# Patient Record
Sex: Female | Born: 1980 | Race: White | Hispanic: No | Marital: Married | Smoking: Never smoker
Health system: Southern US, Community
[De-identification: ages and names within clinical notes are randomized; demographics above are authoritative.]

## PROBLEM LIST (undated history)

## (undated) DIAGNOSIS — Z789 Other specified health status: Secondary | ICD-10-CM

## (undated) DIAGNOSIS — M549 Dorsalgia, unspecified: Secondary | ICD-10-CM

---
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
## 2000-12-17 ENCOUNTER — Emergency Department (HOSPITAL_COMMUNITY): Admission: EM | Admit: 2000-12-17 | Discharge: 2000-12-17 | Payer: Self-pay | Admitting: *Deleted

## 2000-12-17 ENCOUNTER — Encounter: Payer: Self-pay | Admitting: Emergency Medicine

## 2002-03-04 ENCOUNTER — Emergency Department (HOSPITAL_COMMUNITY): Admission: EM | Admit: 2002-03-04 | Discharge: 2002-03-04 | Payer: Self-pay | Admitting: Emergency Medicine

## 2002-03-04 ENCOUNTER — Encounter: Payer: Self-pay | Admitting: Emergency Medicine

## 2005-06-11 ENCOUNTER — Emergency Department (HOSPITAL_COMMUNITY): Admission: EM | Admit: 2005-06-11 | Discharge: 2005-06-11 | Payer: Self-pay | Admitting: Emergency Medicine

## 2005-07-25 ENCOUNTER — Other Ambulatory Visit: Admission: RE | Admit: 2005-07-25 | Discharge: 2005-07-25 | Payer: Self-pay | Admitting: Obstetrics and Gynecology

## 2006-02-28 ENCOUNTER — Inpatient Hospital Stay (HOSPITAL_COMMUNITY): Admission: AD | Admit: 2006-02-28 | Discharge: 2006-03-02 | Payer: Self-pay | Admitting: *Deleted

## 2007-11-03 ENCOUNTER — Inpatient Hospital Stay (HOSPITAL_COMMUNITY): Admission: AD | Admit: 2007-11-03 | Discharge: 2007-11-05 | Payer: Self-pay | Admitting: Obstetrics and Gynecology

## 2010-08-06 ENCOUNTER — Encounter: Payer: Self-pay | Admitting: Obstetrics and Gynecology

## 2011-04-10 LAB — CBC
HCT: 30.3 — ABNORMAL LOW
HCT: 33.8 — ABNORMAL LOW
Hemoglobin: 10.6 — ABNORMAL LOW
Hemoglobin: 11.6 — ABNORMAL LOW
MCHC: 34.2
MCHC: 35.1
MCV: 79.6
MCV: 80
Platelets: 233
Platelets: 253
RBC: 3.78 — ABNORMAL LOW
RBC: 4.24
RDW: 14.5
RDW: 14.9
WBC: 14 — ABNORMAL HIGH
WBC: 14.1 — ABNORMAL HIGH

## 2011-04-10 LAB — RPR: RPR Ser Ql: NONREACTIVE

## 2012-07-16 NOTE — L&D Delivery Note (Signed)
Patient was C/C/+2 and pushed for 10 minutes with epidural.   NSVD  female infant, Apgars 7,9, weight P- apears large for gest age.   The patient had no lacerations. Fundus was firm. EBL was expected. Placenta was delivered intact. Vagina was clear.  Baby was vigorous to bedside.  Rafael Quesada A

## 2012-10-07 LAB — OB RESULTS CONSOLE ABO/RH: RH Type: POSITIVE

## 2012-10-07 LAB — OB RESULTS CONSOLE HIV ANTIBODY (ROUTINE TESTING): HIV: NONREACTIVE

## 2012-10-07 LAB — OB RESULTS CONSOLE ANTIBODY SCREEN: Antibody Screen: NEGATIVE

## 2012-10-07 LAB — OB RESULTS CONSOLE HEPATITIS B SURFACE ANTIGEN: Hepatitis B Surface Ag: NEGATIVE

## 2012-10-07 LAB — OB RESULTS CONSOLE RUBELLA ANTIBODY, IGM: Rubella: IMMUNE

## 2012-10-07 LAB — OB RESULTS CONSOLE RPR: RPR: NONREACTIVE

## 2013-04-25 ENCOUNTER — Encounter (HOSPITAL_COMMUNITY): Payer: BC Managed Care – PPO | Admitting: Anesthesiology

## 2013-04-25 ENCOUNTER — Inpatient Hospital Stay (HOSPITAL_COMMUNITY)
Admission: RE | Admit: 2013-04-25 | Discharge: 2013-04-27 | DRG: 373 | Disposition: A | Discharge status: Home / Self Care | Payer: BC Managed Care – PPO | Source: Ambulatory Visit | Attending: Obstetrics and Gynecology | Admitting: Obstetrics and Gynecology

## 2013-04-25 ENCOUNTER — Encounter (HOSPITAL_COMMUNITY): Payer: Self-pay

## 2013-04-25 ENCOUNTER — Inpatient Hospital Stay (HOSPITAL_COMMUNITY): Payer: BC Managed Care – PPO | Admitting: Anesthesiology

## 2013-04-25 DIAGNOSIS — O480 Post-term pregnancy: Principal | ICD-10-CM | POA: Diagnosis present

## 2013-04-25 HISTORY — DX: Other specified health status: Z78.9

## 2013-04-25 LAB — CBC
HCT: 32.1 % — ABNORMAL LOW (ref 36.0–46.0)
MCH: 28 pg (ref 26.0–34.0)
MCHC: 34.6 g/dL (ref 30.0–36.0)
RBC: 3.96 MIL/uL (ref 3.87–5.11)
RDW: 14.3 % (ref 11.5–15.5)
WBC: 9.4 10*3/uL (ref 4.0–10.5)

## 2013-04-25 LAB — RPR: RPR Ser Ql: NONREACTIVE

## 2013-04-25 MED ORDER — DIPHENHYDRAMINE HCL 25 MG PO CAPS: 25.0000 mg | ORAL_CAPSULE | Freq: Four times a day (QID) | ORAL | Status: DC | PRN

## 2013-04-25 MED ORDER — IBUPROFEN 600 MG PO TABS: 600.0000 mg | ORAL_TABLET | Freq: Four times a day (QID) | ORAL | Status: DC | PRN

## 2013-04-25 MED ORDER — EPHEDRINE 5 MG/ML INJ
10.0000 mg | INTRAVENOUS | Status: DC | PRN
  Filled 2013-04-25: qty 2
  Filled 2013-04-25: qty 4

## 2013-04-25 MED ORDER — TERBUTALINE SULFATE 1 MG/ML IJ SOLN: 0.2500 mg | Freq: Once | INTRAMUSCULAR | Status: DC | PRN

## 2013-04-25 MED ORDER — DIPHENHYDRAMINE HCL 50 MG/ML IJ SOLN: 12.5000 mg | INTRAMUSCULAR | Status: DC | PRN

## 2013-04-25 MED ORDER — TETANUS-DIPHTH-ACELL PERTUSSIS 5-2.5-18.5 LF-MCG/0.5 IM SUSP: 0.5000 mL | Freq: Once | INTRAMUSCULAR | Status: DC

## 2013-04-25 MED ORDER — LIDOCAINE HCL (PF) 1 % IJ SOLN
INTRAMUSCULAR | Status: DC | PRN
  Administered 2013-04-25 (×4): 4 mL

## 2013-04-25 MED ORDER — PRENATAL MULTIVITAMIN CH
1.0000 | ORAL_TABLET | Freq: Every day | ORAL | Status: DC
  Administered 2013-04-26: 1 via ORAL
  Filled 2013-04-25: qty 1

## 2013-04-25 MED ORDER — LACTATED RINGERS IV SOLN
INTRAVENOUS | Status: DC
  Administered 2013-04-25 (×2): via INTRAVENOUS

## 2013-04-25 MED ORDER — LACTATED RINGERS IV SOLN: 500.0000 mL | INTRAVENOUS | Status: DC | PRN

## 2013-04-25 MED ORDER — INFLUENZA VAC SPLIT QUAD 0.5 ML IM SUSP: 0.5000 mL | INTRAMUSCULAR | Status: AC

## 2013-04-25 MED ORDER — LACTATED RINGERS IV SOLN
500.0000 mL | Freq: Once | INTRAVENOUS | Status: AC
  Administered 2013-04-25: 1000 mL via INTRAVENOUS

## 2013-04-25 MED ORDER — OXYTOCIN 40 UNITS IN LACTATED RINGERS INFUSION - SIMPLE MED
1.0000 m[IU]/min | INTRAVENOUS | Status: DC
  Administered 2013-04-25: 2 m[IU]/min via INTRAVENOUS

## 2013-04-25 MED ORDER — FERROUS SULFATE 325 (65 FE) MG PO TABS
325.0000 mg | ORAL_TABLET | Freq: Two times a day (BID) | ORAL | Status: DC
  Administered 2013-04-25 – 2013-04-27 (×4): 325 mg via ORAL
  Filled 2013-04-25 (×4): qty 1

## 2013-04-25 MED ORDER — PHENYLEPHRINE 40 MCG/ML (10ML) SYRINGE FOR IV PUSH (FOR BLOOD PRESSURE SUPPORT)
80.0000 ug | PREFILLED_SYRINGE | INTRAVENOUS | Status: DC | PRN
  Filled 2013-04-25: qty 2

## 2013-04-25 MED ORDER — OXYTOCIN BOLUS FROM INFUSION: 500.0000 mL | INTRAVENOUS | Status: DC

## 2013-04-25 MED ORDER — MAGNESIUM HYDROXIDE 400 MG/5ML PO SUSP: 30.0000 mL | ORAL | Status: DC | PRN

## 2013-04-25 MED ORDER — ONDANSETRON HCL 4 MG PO TABS: 4.0000 mg | ORAL_TABLET | ORAL | Status: DC | PRN

## 2013-04-25 MED ORDER — ONDANSETRON HCL 4 MG/2ML IJ SOLN: 4.0000 mg | Freq: Four times a day (QID) | INTRAMUSCULAR | Status: DC | PRN

## 2013-04-25 MED ORDER — ZOLPIDEM TARTRATE 5 MG PO TABS: 5.0000 mg | ORAL_TABLET | Freq: Every evening | ORAL | Status: DC | PRN

## 2013-04-25 MED ORDER — METHYLERGONOVINE MALEATE 0.2 MG/ML IJ SOLN: 0.2000 mg | INTRAMUSCULAR | Status: DC | PRN

## 2013-04-25 MED ORDER — OXYTOCIN 40 UNITS IN LACTATED RINGERS INFUSION - SIMPLE MED
62.5000 mL/h | INTRAVENOUS | Status: DC
  Filled 2013-04-25: qty 1000

## 2013-04-25 MED ORDER — CITRIC ACID-SODIUM CITRATE 334-500 MG/5ML PO SOLN: 30.0000 mL | ORAL | Status: DC | PRN

## 2013-04-25 MED ORDER — SODIUM CHLORIDE 0.9 % IJ SOLN: 3.0000 mL | Freq: Two times a day (BID) | INTRAMUSCULAR | Status: DC

## 2013-04-25 MED ORDER — EPHEDRINE 5 MG/ML INJ
10.0000 mg | INTRAVENOUS | Status: DC | PRN
  Filled 2013-04-25: qty 2

## 2013-04-25 MED ORDER — SENNOSIDES-DOCUSATE SODIUM 8.6-50 MG PO TABS
2.0000 | ORAL_TABLET | ORAL | Status: DC
  Administered 2013-04-25 – 2013-04-26 (×2): 2 via ORAL
  Filled 2013-04-25: qty 2

## 2013-04-25 MED ORDER — SODIUM CHLORIDE 0.9 % IV SOLN: 250.0000 mL | INTRAVENOUS | Status: DC | PRN

## 2013-04-25 MED ORDER — MEASLES, MUMPS & RUBELLA VAC ~~LOC~~ INJ
0.5000 mL | INJECTION | Freq: Once | SUBCUTANEOUS | Status: DC
  Filled 2013-04-25: qty 0.5

## 2013-04-25 MED ORDER — SODIUM CHLORIDE 0.9 % IJ SOLN: 3.0000 mL | INTRAMUSCULAR | Status: DC | PRN

## 2013-04-25 MED ORDER — OXYCODONE-ACETAMINOPHEN 5-325 MG PO TABS: 1.0000 | ORAL_TABLET | ORAL | Status: DC | PRN

## 2013-04-25 MED ORDER — LIDOCAINE HCL (PF) 1 % IJ SOLN
30.0000 mL | INTRAMUSCULAR | Status: DC | PRN
  Filled 2013-04-25 (×2): qty 30

## 2013-04-25 MED ORDER — PHENYLEPHRINE 40 MCG/ML (10ML) SYRINGE FOR IV PUSH (FOR BLOOD PRESSURE SUPPORT)
80.0000 ug | PREFILLED_SYRINGE | INTRAVENOUS | Status: DC | PRN
  Filled 2013-04-25: qty 5
  Filled 2013-04-25: qty 2

## 2013-04-25 MED ORDER — IBUPROFEN 800 MG PO TABS
800.0000 mg | ORAL_TABLET | Freq: Three times a day (TID) | ORAL | Status: DC
Start: 2013-04-25 — End: 2013-04-27
  Administered 2013-04-25 – 2013-04-26 (×3): 800 mg via ORAL
  Filled 2013-04-25 (×4): qty 1

## 2013-04-25 MED ORDER — FLEET ENEMA 7-19 GM/118ML RE ENEM: 1.0000 | ENEMA | Freq: Every day | RECTAL | Status: DC | PRN

## 2013-04-25 MED ORDER — ACETAMINOPHEN 325 MG PO TABS: 650.0000 mg | ORAL_TABLET | ORAL | Status: DC | PRN

## 2013-04-25 MED ORDER — ONDANSETRON HCL 4 MG/2ML IJ SOLN: 4.0000 mg | INTRAMUSCULAR | Status: DC | PRN

## 2013-04-25 MED ORDER — FENTANYL 2.5 MCG/ML BUPIVACAINE 1/10 % EPIDURAL INFUSION (WH - ANES)
14.0000 mL/h | INTRAMUSCULAR | Status: DC | PRN
  Administered 2013-04-25: 14 mL/h via EPIDURAL
  Filled 2013-04-25: qty 125

## 2013-04-25 MED ORDER — BUTORPHANOL TARTRATE 1 MG/ML IJ SOLN: 1.0000 mg | INTRAMUSCULAR | Status: DC | PRN

## 2013-04-25 MED ORDER — WITCH HAZEL-GLYCERIN EX PADS: 1.0000 "application " | MEDICATED_PAD | CUTANEOUS | Status: DC | PRN

## 2013-04-25 MED ORDER — METHYLERGONOVINE MALEATE 0.2 MG PO TABS: 0.2000 mg | ORAL_TABLET | ORAL | Status: DC | PRN

## 2013-04-25 MED ORDER — BENZOCAINE-MENTHOL 20-0.5 % EX AERO: 1.0000 "application " | INHALATION_SPRAY | CUTANEOUS | Status: DC | PRN

## 2013-04-25 MED ORDER — LANOLIN HYDROUS EX OINT: TOPICAL_OINTMENT | CUTANEOUS | Status: DC | PRN

## 2013-04-25 MED ORDER — DIBUCAINE 1 % RE OINT: 1.0000 "application " | TOPICAL_OINTMENT | RECTAL | Status: DC | PRN

## 2013-04-25 MED ORDER — SIMETHICONE 80 MG PO CHEW: 80.0000 mg | CHEWABLE_TABLET | ORAL | Status: DC | PRN

## 2013-04-25 NOTE — Anesthesia Preprocedure Evaluation (Signed)
Anesthesia Evaluation  Patient identified by MRN, date of birth, ID band Patient awake    Reviewed: Allergy & Precautions, H&P , NPO status , Patient's Chart, lab work & pertinent test results, reviewed documented beta blocker date and time   History of Anesthesia Complications Negative for: history of anesthetic complications  Airway Mallampati: I TM Distance: >3 FB Neck ROM: full    Dental  (+) Teeth Intact   Pulmonary neg pulmonary ROS,  breath sounds clear to auscultation        Cardiovascular negative cardio ROS  Rhythm:regular Rate:Normal     Neuro/Psych negative neurological ROS  negative psych ROS   GI/Hepatic negative GI ROS, Neg liver ROS,   Endo/Other  Morbid obesity  Renal/GU negative Renal ROS     Musculoskeletal   Abdominal   Peds  Hematology negative hematology ROS (+)   Anesthesia Other Findings   Reproductive/Obstetrics (+) Pregnancy                           Anesthesia Physical Anesthesia Plan  ASA: III  Anesthesia Plan: Epidural   Post-op Pain Management:    Induction:   Airway Management Planned:   Additional Equipment:   Intra-op Plan:   Post-operative Plan:   Informed Consent: I have reviewed the patients History and Physical, chart, labs and discussed the procedure including the risks, benefits and alternatives for the proposed anesthesia with the patient or authorized representative who has indicated his/her understanding and acceptance.     Plan Discussed with:   Anesthesia Plan Comments:         Anesthesia Quick Evaluation

## 2013-04-25 NOTE — H&P (Signed)
32 y.o. [redacted]w[redacted]d  G3P2 comes in for post dates induction.  Otherwise has good fetal movement and no bleeding.  Past Medical History  Diagnosis Date  . Medical history non-contributory     Past Surgical History  Procedure Laterality Date  . Hip surgery      32 years old    OB History  Gravida Para Term Preterm AB SAB TAB Ectopic Multiple Living  3 2        2     # Outcome Date GA Lbr Len/2nd Weight Sex Delivery Anes PTL Lv  3 CUR           2 PAR           1 PAR               History   Social History  . Marital Status: Married    Spouse Name: N/A    Number of Children: N/A  . Years of Education: N/A   Occupational History  . Not on file.   Social History Main Topics  . Smoking status: Never Smoker   . Smokeless tobacco: Never Used  . Alcohol Use: No  . Drug Use: No  . Sexual Activity: Yes    Birth Control/ Protection: None   Other Topics Concern  . Not on file   Social History Narrative  . No narrative on file   Review of patient's allergies indicates no known allergies.    Prenatal Transfer Tool  Maternal Diabetes: No Genetic Screening:declined Maternal Ultrasounds/Referrals: Normal Fetal Ultrasounds or other Referrals:  None Maternal Substance Abuse:  No Significant Maternal Medications:  None Significant Maternal Lab Results: None  Other PNC:    Filed Vitals:   04/25/13 1001  BP: 103/54  Pulse: 69  Temp:   Resp: 18     Lungs/Cor:  NAD Abdomen:  soft, gravid Ex:  no cords, erythema SVE:  3.5/60/-2, AROM clear FHTs:  130, good STV, NST R Toco:  q5-10   A/P   Post dates induction.  GBS neg.  Stiven Kaspar A

## 2013-04-25 NOTE — Anesthesia Procedure Notes (Signed)
Epidural Patient location during procedure: OB Start time: 04/25/2013 12:45 PM  Staffing Performed by: anesthesiologist   Preanesthetic Checklist Completed: patient identified, site marked, surgical consent, pre-op evaluation, timeout performed, IV checked, risks and benefits discussed and monitors and equipment checked  Epidural Patient position: sitting Prep: site prepped and draped and DuraPrep Patient monitoring: continuous pulse ox and blood pressure Approach: midline Injection technique: LOR air  Needle:  Needle type: Tuohy  Needle gauge: 17 G Needle length: 9 cm and 9 Needle insertion depth: 10 cm Catheter type: closed end flexible Catheter size: 19 Gauge Catheter at skin depth: 15 cm Test dose: negative  Assessment Events: blood not aspirated, injection not painful, no injection resistance, negative IV test and no paresthesia  Additional Notes Discussed risk of headache, infection, bleeding, nerve injury and failed or incomplete block.  Patient voices understanding and wishes to proceed.  Epidural placed easily on first attempt.  No paresthesia.  Patient tolerated procedure well with no apparent complications.  Jasmine December, MDReason for block:procedure for pain

## 2013-04-26 ENCOUNTER — Encounter (HOSPITAL_COMMUNITY): Payer: Self-pay

## 2013-04-26 LAB — CBC
Hemoglobin: 10.2 g/dL — ABNORMAL LOW (ref 12.0–15.0)
MCH: 27.6 pg (ref 26.0–34.0)
Platelets: 228 10*3/uL (ref 150–400)
RBC: 3.69 MIL/uL — ABNORMAL LOW (ref 3.87–5.11)
WBC: 11 10*3/uL — ABNORMAL HIGH (ref 4.0–10.5)

## 2013-04-26 NOTE — Anesthesia Postprocedure Evaluation (Signed)
  Anesthesia Post-op Note  Patient: Caroline Maynard  Procedure(s) Performed: * No procedures listed *  Patient Location: Mother/Baby  Anesthesia Type:Epidural  Level of Consciousness: awake and alert   Airway and Oxygen Therapy: Patient Spontanous Breathing  Post-op Pain: mild  Post-op Assessment: Patient's Cardiovascular Status Stable, Respiratory Function Stable, No signs of Nausea or vomiting, Adequate PO intake, Pain level controlled, No headache, No residual numbness and No residual motor weakness  Post-op Vital Signs: stable  Complications: No apparent anesthesia complications

## 2013-04-26 NOTE — Progress Notes (Signed)
Patient is eating, ambulating, voiding.  Pain control is good.  Filed Vitals:   04/25/13 1625 04/25/13 1700 04/25/13 2145 04/26/13 0558  BP: 119/69 116/69 118/72 95/51  Pulse: 72 77 78 80  Temp: 99 F (37.2 C) 98.8 F (37.1 C) 98.7 F (37.1 C) 98.3 F (36.8 C)  TempSrc: Oral Oral Oral Oral  Resp: 18 18 18 17   Height:      Weight:      SpO2:   99%     Fundus firm Perineum without swelling.  Lab Results  Component Value Date   WBC 11.0* 04/26/2013   HGB 10.2* 04/26/2013   HCT 30.3* 04/26/2013   MCV 82.1 04/26/2013   PLT 228 04/26/2013    --/--/A POS (10/11 0800)/RI  A/P Post partum day 1.  Routine care.  Expect d/c tomorrow.   Parents desires circumsision.  All risks, benefits and alternatives discussed with the mother.  Caroline Maynard A

## 2013-04-26 NOTE — Progress Notes (Signed)
Addendum to delivery note-  Supra pubic pressure was applied during delivery but not true shoulder dystocia was seen- shoulder slipped slowly but steadily under the pubic bone and suprapubic pressure helped delivery when the hand came out with the shoulders.

## 2013-04-27 NOTE — Discharge Summary (Signed)
Obstetric Discharge Summary Reason for Admission: induction of labor Prenatal Procedures: ultrasound Intrapartum Procedures: spontaneous vaginal delivery Postpartum Procedures: none Complications-Operative and Postpartum none Hemoglobin  Date Value Range Status  04/26/2013 10.2* 12.0 - 15.0 g/dL Final     HCT  Date Value Range Status  04/26/2013 30.3* 36.0 - 46.0 % Final    Physical Exam:  General: alert and cooperative Lochia: appropriate Uterine Fundus: firm DVT Evaluation: No evidence of DVT seen on physical exam.  Discharge Diagnoses: Term Pregnancy-delivered  Discharge Information: Date: 04/27/2013 Activity: pelvic rest Diet: routine Medications: PNV and Ibuprofen Condition: stable Instructions: refer to practice specific booklet Discharge to: home Follow-up Information   Follow up with HORVATH,MICHELLE A, MD In 4 weeks.   Specialty:  Obstetrics and Gynecology   Contact information:   [address] [place] [address] [phone]       Newborn Data: Live born female  Birth Weight: 10 lb 11 oz (4848 g) APGAR: 7, 9  Home with mother.  Philip Aspen 04/27/2013, 9:59 AM

## 2013-04-29 LAB — TYPE AND SCREEN
ABO/RH(D): A POS
Unit division: 0
Unit division: 0

## 2013-11-30 ENCOUNTER — Emergency Department (INDEPENDENT_AMBULATORY_CARE_PROVIDER_SITE_OTHER): Payer: BC Managed Care – PPO

## 2013-11-30 ENCOUNTER — Encounter (HOSPITAL_COMMUNITY): Payer: Self-pay | Admitting: Emergency Medicine

## 2013-11-30 ENCOUNTER — Emergency Department (HOSPITAL_COMMUNITY)
Admission: EM | Admit: 2013-11-30 | Discharge: 2013-11-30 | Disposition: A | Discharge status: Home / Self Care | Payer: BC Managed Care – PPO | Source: Home / Self Care | Attending: Family Medicine | Admitting: Family Medicine

## 2013-11-30 DIAGNOSIS — M545 Low back pain, unspecified: Secondary | ICD-10-CM

## 2013-11-30 HISTORY — DX: Dorsalgia, unspecified: M54.9

## 2013-11-30 LAB — POCT URINALYSIS DIP (DEVICE)
Bilirubin Urine: NEGATIVE
Glucose, UA: NEGATIVE mg/dL
Hgb urine dipstick: NEGATIVE
Ketones, ur: NEGATIVE mg/dL
LEUKOCYTES UA: NEGATIVE
NITRITE: NEGATIVE
PH: 7 (ref 5.0–8.0)
PROTEIN: NEGATIVE mg/dL
Specific Gravity, Urine: 1.02 (ref 1.005–1.030)
UROBILINOGEN UA: 0.2 mg/dL (ref 0.0–1.0)

## 2013-11-30 LAB — POCT PREGNANCY, URINE: Preg Test, Ur: NEGATIVE

## 2013-11-30 MED ORDER — TRAMADOL HCL 50 MG PO TABS: 50.0000 mg | ORAL_TABLET | Freq: Two times a day (BID) | ORAL | Status: DC | PRN

## 2013-11-30 MED ORDER — DICLOFENAC SODIUM 50 MG PO TBEC: 50.0000 mg | DELAYED_RELEASE_TABLET | Freq: Three times a day (TID) | ORAL | Status: DC

## 2013-11-30 MED ORDER — METHOCARBAMOL 500 MG PO TABS: 500.0000 mg | ORAL_TABLET | Freq: Four times a day (QID) | ORAL | Status: DC | PRN

## 2013-11-30 NOTE — ED Notes (Signed)
C/o back pain through her pregnancy- 9 mos. And thought it would get better after she delivered but it didn't.  Delivered 04/25/13.    She told her OB-GYN at her 6 wk recheck and it had improved slightly.  They told her, she had a bigger baby and had an epidural-was told to give it more time.

## 2013-11-30 NOTE — Discharge Instructions (Signed)
Back Pain, Adult Back pain is very common. The pain often gets better over time. The cause of back pain is usually not dangerous. Most people can learn to manage their back pain on their own.  HOME CARE   Stay active. Start with short walks on flat ground if you can. Try to walk farther each day.  Do not sit, drive, or stand in one place for more than 30 minutes. Do not stay in bed.  Do not avoid exercise or work. Activity can help your back heal faster.  Be careful when you bend or lift an object. Bend at your knees, keep the object close to you, and do not twist.  Sleep on a firm mattress. Lie on your side, and bend your knees. If you lie on your back, put a pillow under your knees.  Only take medicines as told by your doctor.  Put ice on the injured area.  Put ice in a plastic bag.  Place a towel between your skin and the bag.  Leave the ice on for 15-20 minutes, 03-04 times a day for the first 2 to 3 days. After that, you can switch between ice and heat packs.  Ask your doctor about back exercises or massage.  Avoid feeling anxious or stressed. Find good ways to deal with stress, such as exercise. GET HELP RIGHT AWAY IF:   Your pain does not go away with rest or medicine.  Your pain does not go away in 1 week.  You have new problems.  You do not feel well.  The pain spreads into your legs.  You cannot control when you poop (bowel movement) or pee (urinate).  Your arms or legs feel weak or lose feeling (numbness).  You feel sick to your stomach (nauseous) or throw up (vomit).  You have belly (abdominal) pain.  You feel like you may pass out (faint). MAKE SURE YOU:   Understand these instructions.  Will watch your condition.  Will get help right away if you are not doing well or get worse. Document Released: 12/19/2007 Document Revised: 09/24/2011 Document Reviewed: 11/20/2010 [facility] Patient Information 2014 [place], [place].   Back Exercises Back  exercises help treat and prevent back injuries. The goal of back exercises is to increase the strength of your abdominal and back muscles and the flexibility of your back. These exercises should be started when you no longer have back pain. Back exercises include:  Pelvic Tilt. Lie on your back with your knees bent. Tilt your pelvis until the lower part of your back is against the floor. Hold this position 5 to 10 sec and repeat 5 to 10 times.  Knee to Chest. Pull first 1 knee up against your chest and hold for 20 to 30 seconds, repeat this with the other knee, and then both knees. This may be done with the other leg straight or bent, whichever feels better.  Sit-Ups or Curl-Ups. Bend your knees 90 degrees. Start with tilting your pelvis, and do a partial, slow sit-up, lifting your trunk only 30 to 45 degrees off the floor. Take at least 2 to 3 seconds for each sit-up. Do not do sit-ups with your knees out straight. If partial sit-ups are difficult, simply do the above but with only tightening your abdominal muscles and holding it as directed.  Hip-Lift. Lie on your back with your knees flexed 90 degrees. Push down with your feet and shoulders as you raise your hips a couple inches off the floor;  hold for 10 seconds, repeat 5 to 10 times.  Back arches. Lie on your stomach, propping yourself up on bent elbows. Slowly press on your hands, causing an arch in your low back. Repeat 3 to 5 times. Any initial stiffness and discomfort should lessen with repetition over time.  Shoulder-Lifts. Lie face down with arms beside your body. Keep hips and torso pressed to floor as you slowly lift your head and shoulders off the floor. Do not overdo your exercises, especially in the beginning. Exercises may cause you some mild back discomfort which lasts for a few minutes; however, if the pain is more severe, or lasts for more than 15 minutes, do not continue exercises until you see your caregiver. Improvement with  exercise therapy for back problems is slow.  See your caregivers for assistance with developing a proper back exercise program. Document Released: 08/09/2004 Document Revised: 09/24/2011 Document Reviewed: 05/03/2011 [facility] Patient Information 2014 [place]ExitCare, [place]LL[place].

## 2013-11-30 NOTE — ED Provider Notes (Signed)
CSN: [id number]     Arrival date & time 11/30/13  1910 History   First MD Initiated Contact with Patient 11/30/13 2011     Chief Complaint  Patient presents with  . Back Pain   (Consider location/radiation/quality/duration/timing/severity/associated sxs/prior Treatment) HPI  Patient is a 33 yo F presenting with mid to low back pain. States when she had her last baby in October, she had persistent LBP during pregnancy and has had pain since that time. Had epidural during delivery without complications. Midline and to the right side. Reports pain, stiffness, numbness in the back especially in the last 1.5 months. No known injury. She has tried icy hot, epsom salt, ice pack with minimal relief. Pain is worse with prolonged activity or position. Unable to identify alleviating factors. No fevers, no urinary or fecal incontinence. 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 Everything resolved until last year.   Past Medical History  Diagnosis Date  . Medical history non-contributory   . Back pain    Past Surgical History  Procedure Laterality Date  . Hip surgery Right [id number]    33 years old- pushed down steps at [email or web address]   Family History  Problem Relation Age of Onset  . Aneurysm Mother    History  Substance Use Topics  . Smoking status: Never Smoker   . Smokeless tobacco: Never Used  . Alcohol Use: No   OB History   Grav Para Term Preterm Abortions TAB SAB Ect Mult Living   3 3 1       3      Review of Systems  Constitutional: Negative for fever and chills.  HENT: Negative for congestion.   Eyes: Negative for visual disturbance.  Respiratory: Negative for cough and shortness of breath.   Cardiovascular: Negative for chest pain and leg swelling.  Gastrointestinal: Negative for abdominal pain.  Genitourinary: Negative for dysuria.  Musculoskeletal: Positive for arthralgias, back pain and gait problem.  Negative for myalgias.  Skin: Negative for rash.  Neurological: Negative for headaches.    Allergies  Review of patient's allergies indicates no known allergies.  Home Medications   Prior to Admission medications   Medication Sig Start Date End Date Taking? Authorizing Provider  calcium carbonate (TUMS - DOSED IN MG ELEMENTAL CALCIUM) 500 MG chewable tablet Chew 2 tablets by mouth 4 (four) times daily as needed for heartburn.    Historical Provider, MD   BP 127/87  Pulse 74  Temp(Src) 97.2 F (36.2 C) (Oral)  Resp 20  SpO2 99%  LMP 11/12/2013  Breastfeeding? No Physical Exam  Constitutional: She is oriented to person, place, and time. She appears well-developed and well-nourished.  Uncomfortable appearing. Tearful with any movement  HENT:  Head: Normocephalic and atraumatic.  Mouth/Throat: Oropharynx is clear and moist.  Neck: Normal range of motion. Neck supple.  Cardiovascular: Normal rate, regular rhythm and normal heart sounds.   Pulmonary/Chest: Effort normal and breath sounds normal.  Abdominal: Soft. There is no tenderness.  Musculoskeletal: She exhibits no edema.       Thoracic back: She exhibits decreased range of motion, tenderness, bony tenderness and spasm. She exhibits no swelling and no deformity.       Lumbar back: She exhibits decreased range of motion, tenderness, bony tenderness and spasm. She exhibits no swelling and no deformity.  Decreased ROM in all directions secondary to discomfort. Exquisitely tender to light palpation in thoracic and  lumbar spine. Neg straight leg raise. Sensation and strength normal in lower extremities.  Neurological: She is alert and oriented to person, place, and time. She has normal reflexes.  Skin: Skin is warm and dry.  Psychiatric: She has a normal mood and affect.    ED Course  Procedures (including critical care time) Labs Review Labs Reviewed  POCT URINALYSIS DIP (DEVICE)  POCT PREGNANCY, URINE   Imaging Review Dg  Lumbar Spine 2-3 Views  11/30/2013   CLINICAL DATA:  Low back pain. No trauma. Postpartum a few months ago.  EXAM: LUMBAR SPINE - 2-3 VIEW  COMPARISON:  None  FINDINGS: Five lumbar type vertebral bodies. Sacroiliac joints are symmetric. Maintenance of vertebral body height and alignment. Mild loss of intervertebral disc height in the lower thoracic spine. T11-12. Lumbar intervertebral disc heights are maintained. Facet arthropathy at L5-S1.  IMPRESSION: No acute osseous abnormality.   Electronically Signed   By: Jeronimo GreavesKyle  Talbot M.D.   On: 11/30/2013 21:22    MDM   1. Low back pain    33 yo F with progressively worsening back pain - X-ray shows mild disc space narrowing in lower thoracic which could be contributing to her pain, but most likely functional back pain with muscle spasm - Tramadol severe pain (#20 given) - Robaxin for spasm (#20) - Diclofenac prn antiinflammatory - Back exercises to help strengthen core - F/u with PCP, or ortho if necessary    Hilarie FredricksonAmber M Hairford, MD 11/30/13 2131

## 2013-12-01 NOTE — ED Provider Notes (Signed)
Medical screening examination/treatment/procedure(s) were performed by resident physician or non-physician practitioner and as supervising physician I was immediately available for consultation/collaboration.   Leith Hedlund DOUGLAS MD.   Gilverto Dileonardo D Claire Dolores, MD 12/01/13 1653 

## 2014-05-17 ENCOUNTER — Encounter (HOSPITAL_COMMUNITY): Payer: Self-pay | Admitting: Emergency Medicine

## 2015-06-03 IMAGING — CR DG LUMBAR SPINE 2-3V
3 series · 3 of 3 positions shown · non-contrast
Comparison: None

CLINICAL DATA: Low back pain. No trauma. Postpartum a few months
ago.

EXAM:
LUMBAR SPINE - 2-3 VIEW

[view not recorded (1 of 3)]
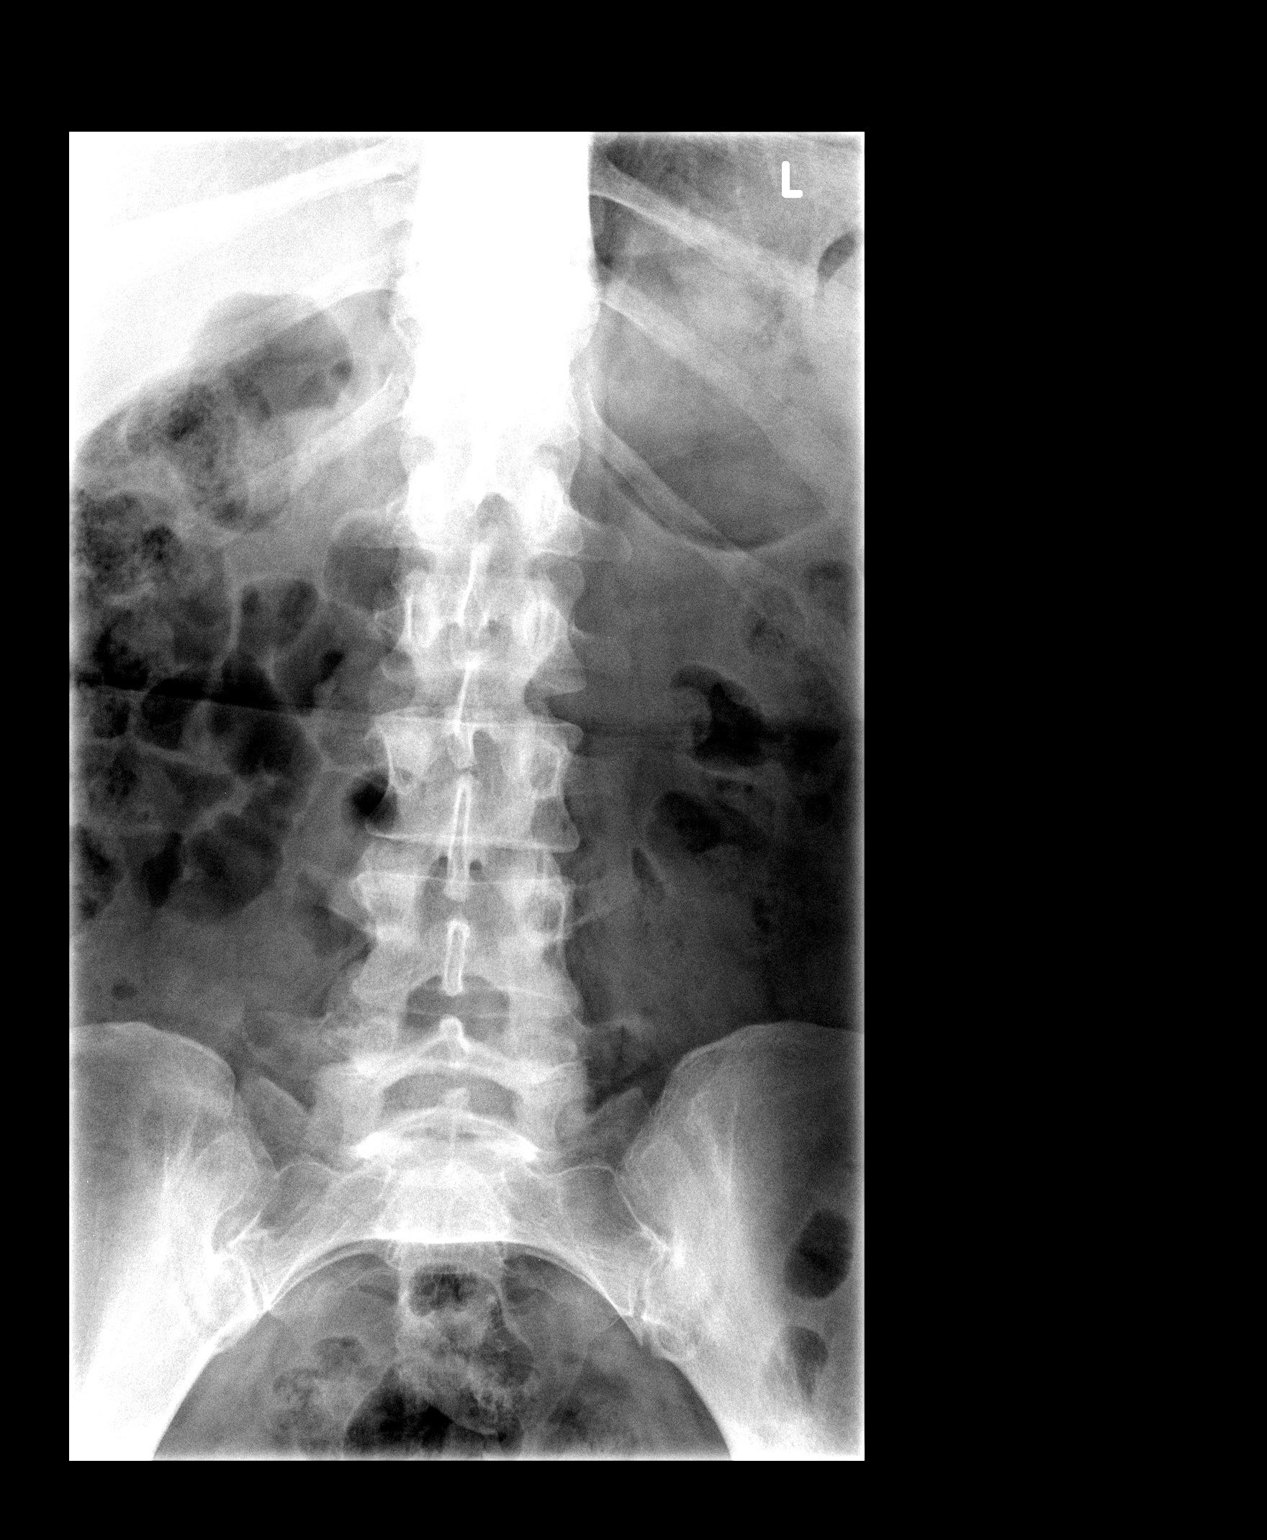

[view not recorded (2 of 3)]
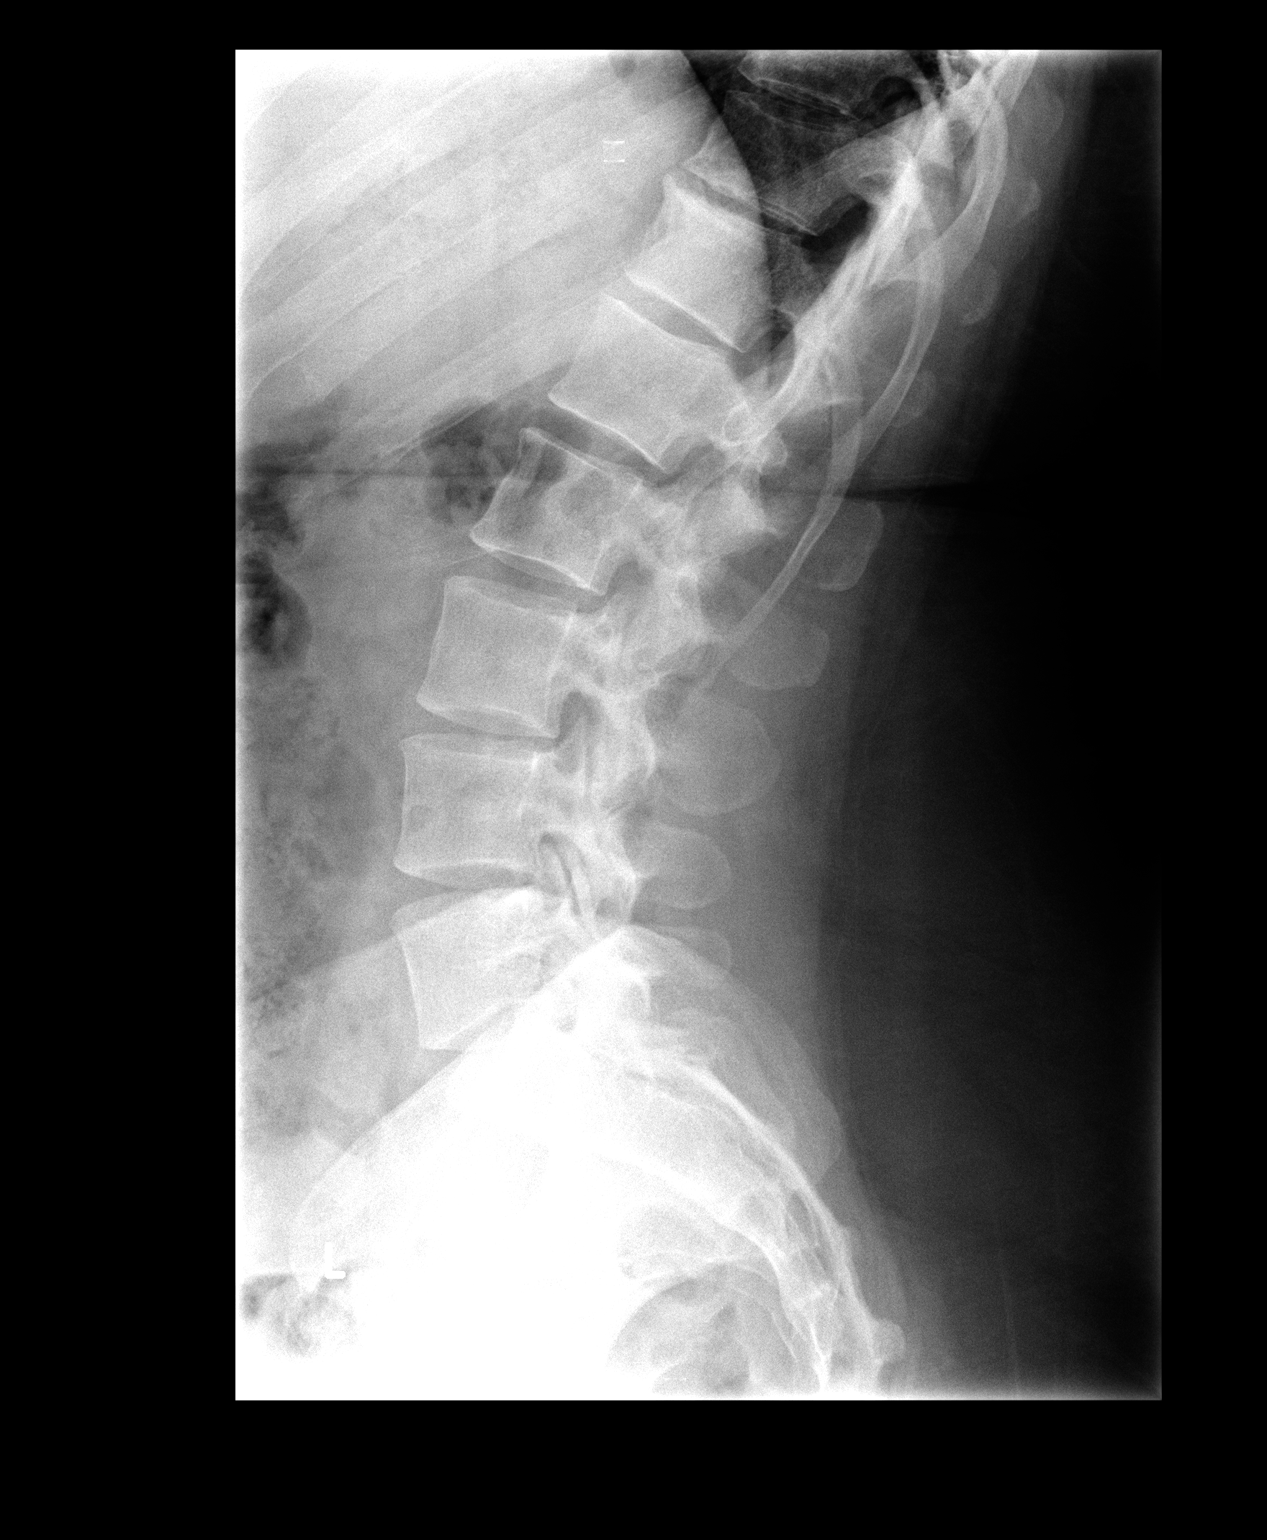

[view not recorded (3 of 3)]
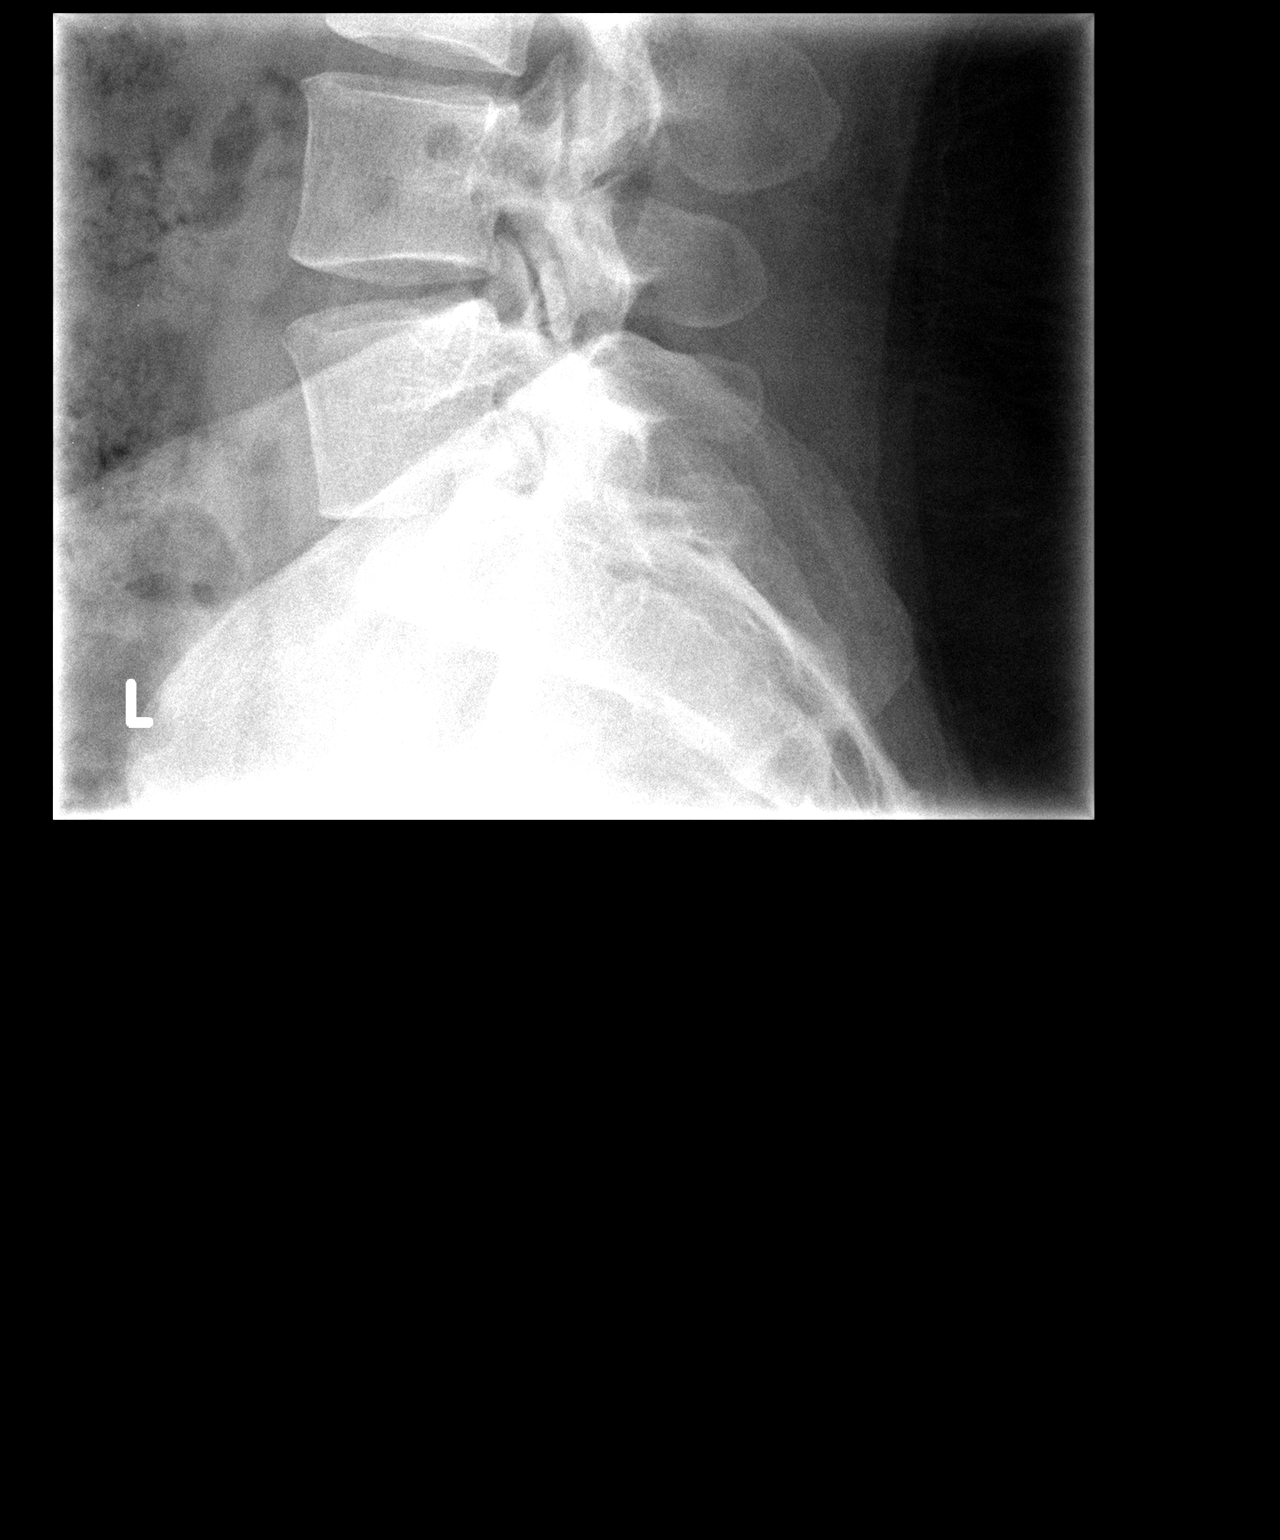

[3 of 3 positions shown; findings below may reference images not displayed]

FINDINGS: Five lumbar type vertebral bodies. Sacroiliac joints are symmetric.
Maintenance of vertebral body height and alignment. Mild loss of
intervertebral disc height in the lower thoracic spine. T11-12.
Lumbar intervertebral disc heights are maintained. Facet arthropathy
at L5-S1.
IMPRESSION: No acute osseous abnormality.

## 2017-02-10 ENCOUNTER — Emergency Department (HOSPITAL_COMMUNITY): Payer: BLUE CROSS/BLUE SHIELD

## 2017-02-10 ENCOUNTER — Encounter (HOSPITAL_COMMUNITY): Payer: Self-pay | Admitting: Emergency Medicine

## 2017-02-10 ENCOUNTER — Emergency Department (HOSPITAL_COMMUNITY)
Admission: EM | Admit: 2017-02-10 | Discharge: 2017-02-10 | Disposition: A | Discharge status: Home / Self Care | Payer: BLUE CROSS/BLUE SHIELD | Attending: Emergency Medicine | Admitting: Emergency Medicine

## 2017-02-10 DIAGNOSIS — R1115 Cyclical vomiting syndrome unrelated to migraine: Secondary | ICD-10-CM

## 2017-02-10 DIAGNOSIS — E860 Dehydration: Secondary | ICD-10-CM | POA: Diagnosis not present

## 2017-02-10 DIAGNOSIS — R1084 Generalized abdominal pain: Secondary | ICD-10-CM | POA: Insufficient documentation

## 2017-02-10 LAB — URINALYSIS, ROUTINE W REFLEX MICROSCOPIC
BACTERIA UA: NONE SEEN
Bilirubin Urine: NEGATIVE
Glucose, UA: NEGATIVE mg/dL
Ketones, ur: NEGATIVE mg/dL
Nitrite: NEGATIVE
Protein, ur: NEGATIVE mg/dL
pH: 7 (ref 5.0–8.0)

## 2017-02-10 LAB — COMPREHENSIVE METABOLIC PANEL
ALBUMIN: 3.8 g/dL (ref 3.5–5.0)
ALK PHOS: 50 U/L (ref 38–126)
ALT: 14 U/L (ref 14–54)
ANION GAP: 10 (ref 5–15)
AST: 19 U/L (ref 15–41)
BUN: 5 mg/dL — AB (ref 6–20)
CALCIUM: 9 mg/dL (ref 8.9–10.3)
CO2: 20 mmol/L — AB (ref 22–32)
Chloride: 104 mmol/L (ref 101–111)
Creatinine, Ser: 0.59 mg/dL (ref 0.44–1.00)
GFR calc Af Amer: >60 mL/min (ref >60)
GFR calc non Af Amer: >60 mL/min (ref >60)
GLUCOSE: 136 mg/dL — AB (ref 65–99)
POTASSIUM: 4.1 mmol/L (ref 3.5–5.1)
SODIUM: 134 mmol/L — AB (ref 135–145)
Total Bilirubin: 0.5 mg/dL (ref 0.3–1.2)
Total Protein: 7.2 g/dL (ref 6.5–8.1)

## 2017-02-10 LAB — CBC
HEMATOCRIT: 40.1 % (ref 36.0–46.0)
HEMOGLOBIN: 13.6 g/dL (ref 12.0–15.0)
MCH: 28.2 pg (ref 26.0–34.0)
MCHC: 33.9 g/dL (ref 30.0–36.0)
MCV: 83 fL (ref 78.0–100.0)
Platelets: 365 10*3/uL (ref 150–400)
RBC: 4.83 MIL/uL (ref 3.87–5.11)
RDW: 12.7 % (ref 11.5–15.5)
WBC: 11.3 10*3/uL — ABNORMAL HIGH (ref 4.0–10.5)

## 2017-02-10 LAB — I-STAT BETA HCG BLOOD, ED (MC, WL, AP ONLY)

## 2017-02-10 LAB — LIPASE, BLOOD: Lipase: 31 U/L (ref 11–51)

## 2017-02-10 MED ORDER — ONDANSETRON 4 MG PO TBDP
4.0000 mg | ORAL_TABLET | Freq: Once | ORAL | Status: AC | PRN
  Administered 2017-02-10: 4 mg via ORAL

## 2017-02-10 MED ORDER — METOCLOPRAMIDE HCL 5 MG/ML IJ SOLN
10.0000 mg | Freq: Once | INTRAMUSCULAR | Status: AC
  Administered 2017-02-10: 10 mg via INTRAVENOUS
  Filled 2017-02-10: qty 2

## 2017-02-10 MED ORDER — ONDANSETRON HCL 4 MG/2ML IJ SOLN
4.0000 mg | Freq: Once | INTRAMUSCULAR | Status: AC
  Administered 2017-02-10: 4 mg via INTRAVENOUS
  Filled 2017-02-10: qty 2

## 2017-02-10 MED ORDER — SODIUM CHLORIDE 0.9 % IV BOLUS (SEPSIS)
1000.0000 mL | Freq: Once | INTRAVENOUS | Status: AC
  Administered 2017-02-10: 1000 mL via INTRAVENOUS

## 2017-02-10 MED ORDER — FENTANYL CITRATE (PF) 100 MCG/2ML IJ SOLN
50.0000 ug | Freq: Once | INTRAMUSCULAR | Status: AC
  Administered 2017-02-10: 50 ug via INTRAVENOUS
  Filled 2017-02-10: qty 2

## 2017-02-10 MED ORDER — IOPAMIDOL (ISOVUE-300) INJECTION 61%
100.0000 mL | Freq: Once | INTRAVENOUS | Status: AC | PRN
  Administered 2017-02-10: 100 mL via INTRAVENOUS

## 2017-02-10 MED ORDER — DIPHENHYDRAMINE HCL 50 MG/ML IJ SOLN
25.0000 mg | Freq: Once | INTRAMUSCULAR | Status: AC
  Administered 2017-02-10: 25 mg via INTRAVENOUS
  Filled 2017-02-10: qty 1

## 2017-02-10 MED ORDER — ONDANSETRON 4 MG PO TBDP
ORAL_TABLET | ORAL | Status: AC
  Filled 2017-02-10: qty 1

## 2017-02-10 MED ORDER — ONDANSETRON 4 MG PO TBDP: ORAL_TABLET | ORAL | 0 refills | Status: DC

## 2017-02-10 NOTE — Discharge Instructions (Signed)
Take zofran as needed for nausea.   Stay hydrated.   Take tylenol, motrin for pain or fever.   Expect decrease appetite for several days.   See your doctor  Return to ER if you have worse abdominal pain, vomiting, fever.

## 2017-02-10 NOTE — ED Notes (Signed)
PO challenge started

## 2017-02-10 NOTE — ED Provider Notes (Signed)
[facility] Provider Note   CSN: [id number] Arrival date & time: 02/10/17  0249  By signing my name below, I, Ny'Kea Lewis, attest that this documentation has been prepared under the direction and in the presence of Zadie Rhine, MD. Electronically Signed: Karren Cobble, ED Scribe. 02/10/17. 3:58 AM.  History   Chief Complaint Chief Complaint  Patient presents with  . Abdominal Cramping  . Nausea   The history is provided by the patient. No language interpreter was used.  Abdominal Cramping  This is a new problem. The current episode started 2 days ago. The problem occurs constantly. The problem has been gradually worsening. Associated symptoms include abdominal pain. Pertinent negatives include no chest pain and no shortness of breath. Relieved by: pressure.    HPI HPI Comments: Caroline Maynard is a 36 y.o. female with no pertinent history who presents to the Emergency Department complaining of sudden onset, persistent "cramping" to her RUQ in her abdomen that began two days ago. She notes associated nausea and vomiting. Pt reports two days ago she began to experience RUQ abdominal cramping. She notes she has been unable to eat due to a loss of appetite. She reports getting alleviating of her symptoms with applying pressure to the area. Today she tried Midol with mild relief. She reports haviing an IUD placed two months ago, but she is unsure if her symptoms are related. Denies shortness of breath, chest pain, dysuria, urinary urgency or frequency, or back pain.   Past Medical History:  Diagnosis Date  . Back pain   . Medical history non-contributory     There are no active problems to display for this patient.  Past Surgical History:  Procedure Laterality Date  . HIP SURGERY Right 8748   36 years old- pushed down steps at [email or web address]    OB History    Gravida Para Term Preterm AB Living   3 3 1     3    SAB TAB Ectopic Multiple Live Births           1      Home  Medications    Prior to Admission medications   Medication Sig Start Date End Date Taking? Authorizing Provider  calcium carbonate (TUMS - DOSED IN MG ELEMENTAL CALCIUM) 500 MG chewable tablet Chew 2 tablets by mouth 4 (four) times daily as needed for heartburn.    [provider]  diclofenac (VOLTAREN) 50 MG EC tablet Take 1 tablet (50 mg total) by mouth 3 (three) times daily. 11/30/13   Hairford, Ricki Miller, MD  methocarbamol (ROBAXIN) 500 MG tablet Take 1 tablet (500 mg total) by mouth every 6 (six) hours as needed for muscle spasms. 11/30/13   Hairford, Ricki Miller, MD  traMADol (ULTRAM) 50 MG tablet Take 1 tablet (50 mg total) by mouth every 12 (twelve) hours as needed. 11/30/13   Hairford, Ricki Miller, MD    Family History Family History  Problem Relation Age of Onset  . Aneurysm Mother    Social History Social History  Substance Use Topics  . Smoking status: Never Smoker  . Smokeless tobacco: Never Used  . Alcohol use No   Allergies   Patient has no known allergies.  Review of Systems Review of Systems  Constitutional: Positive for appetite change.  Respiratory: Negative for shortness of breath.   Cardiovascular: Negative for chest pain.  Gastrointestinal: Positive for abdominal pain, nausea and vomiting.  Genitourinary: Negative for dysuria, frequency and urgency.  Musculoskeletal: Negative for back pain.  Physical Exam Updated Vital Signs BP 138/76 (BP Location: Right Arm)   Pulse 82   Temp 97.7 F (36.5 C) (Oral)   Resp 20   Ht 5\' 7"  (1.702 m)   Wt 280 lb (127 kg)   LMP 12/18/2016   SpO2 98%   BMI 43.85 kg/m   Physical Exam CONSTITUTIONAL: Well developed/well nourished, uncomfortable appearing  HEAD: Normocephalic/atraumatic EYES: EOMI/PERRL , no icterus  ENMT: Mucous membranes moist NECK: supple no meningeal signs SPINE/BACK:entire spine nontender CV: S1/S2 noted, no murmurs/rubs/gallops noted LUNGS: Lungs are clear to auscultation bilaterally, no  apparent distress ABDOMEN: soft, diffuse tenderness noted, no rebound or guarding, bowel sounds noted throughout abdomen GU:no cva tenderness NEURO: Pt is awake/alert/appropriate, moves all extremitiesx4.  No facial droop.   EXTREMITIES: pulses normal/equal, full ROM SKIN: warm, color normal PSYCH: no abnormalities of mood noted, alert and oriented to situation  ED Treatments / Results  DIAGNOSTIC STUDIES: Oxygen Saturation is 98% on RA, normal by my interpretation.   COORDINATION OF CARE: 3:51 AM-Discussed next steps with pt. Pt verbalized understanding and is agreeable with the plan.   Labs (all labs ordered are listed, but only abnormal results are displayed) Labs Reviewed  COMPREHENSIVE METABOLIC PANEL - Abnormal; Notable for the following:       Result Value   Sodium 134 (*)    CO2 20 (*)    Glucose, Bld 136 (*)    BUN 5 (*)    All other components within normal limits  CBC - Abnormal; Notable for the following:    WBC 11.3 (*)    All other components within normal limits  URINALYSIS, ROUTINE W REFLEX MICROSCOPIC - Abnormal; Notable for the following:    Color, Urine STRAW (*)    Specific Gravity, Urine >1.046 (*)    Hgb urine dipstick MODERATE (*)    Leukocytes, UA SMALL (*)    Squamous Epithelial / LPF 0-5 (*)    All other components within normal limits  LIPASE, BLOOD  I-STAT BETA HCG BLOOD, ED ([facility], [facility], [facility] ONLY)   EKG  EKG Interpretation  Date/Time:  Sunday February 10 2017 07:49:16 EDT Ventricular Rate:  52 PR Interval:    QRS Duration: 102 QT Interval:  480 QTC Calculation: 447 R Axis:   27 Text Interpretation:  Sinus rhythm Normal ECG No previous ECGs available Confirmed by Zadie RhineWickline, Kayloni Rocco ([id number]) on 02/10/2017 8:08:32 AM       Radiology Ct Abdomen Pelvis W Contrast  Result Date: 02/10/2017 CLINICAL DATA:  Epigastric pain with nausea and vomiting for 2 days. EXAM: CT ABDOMEN AND PELVIS WITH CONTRAST TECHNIQUE: Multidetector CT imaging of the abdomen  and pelvis was performed using the standard protocol following bolus administration of intravenous contrast. CONTRAST:  100mL ISOVUE-300 IOPAMIDOL (ISOVUE-300) INJECTION 61% COMPARISON:  None. FINDINGS: Lower chest: No acute abnormality. Hepatobiliary: No focal liver abnormality is seen. No gallstones, gallbladder wall thickening, or biliary dilatation. Pancreas: Unremarkable. No pancreatic ductal dilatation or surrounding inflammatory changes. Spleen: Normal in size without focal abnormality. Adrenals/Urinary Tract: Adrenal glands are unremarkable. Kidneys are normal, without renal calculi, focal lesion, or hydronephrosis. Bladder is unremarkable. Stomach/Bowel: Stomach is within normal limits. Appendix is normal. No evidence of bowel wall thickening, distention, or inflammatory changes. Vascular/Lymphatic: No significant vascular findings are present. No enlarged abdominal or pelvic lymph nodes. Reproductive: 3.7 cm right ovarian cyst, probably physiologic appear Uterus and bilateral adnexa are otherwise unremarkable. IUD appears satisfactorily positioned. Other: No focal inflammation. No ascites. Fat containing umbilical hernia.  Musculoskeletal: No significant skeletal lesion. IMPRESSION: No significant abnormality. No acute findings. Fat containing umbilical hernia. Electronically Signed   By: Ellery Plunkaniel R Mitchell M.D.   On: 02/10/2017 05:40    Procedures Procedures    Medications Ordered in ED Medications  ondansetron (ZOFRAN-ODT) 4 MG disintegrating tablet (  Canceled Entry 02/10/17 0405)  ondansetron (ZOFRAN-ODT) disintegrating tablet 4 mg (4 mg Oral Given 02/10/17 0258)  fentaNYL (SUBLIMAZE) injection 50 mcg (50 mcg Intravenous Given 02/10/17 0404)  ondansetron (ZOFRAN) injection 4 mg (4 mg Intravenous Given 02/10/17 0405)  iopamidol (ISOVUE-300) 61 % injection 100 mL (100 mLs Intravenous Contrast Given 02/10/17 0521)  ondansetron (ZOFRAN) injection 4 mg (4 mg Intravenous Given 02/10/17 0659)  sodium  chloride 0.9 % bolus 1,000 mL (1,000 mLs Intravenous New Bag/Given 02/10/17 0746)  metoCLOPramide (REGLAN) injection 10 mg (10 mg Intravenous Given 02/10/17 0749)  diphenhydrAMINE (BENADRYL) injection 25 mg (25 mg Intravenous Given 02/10/17 0753)     Initial Impression / Assessment and Plan / ED Course  I have reviewed the triage vital signs and the nursing notes.  Pertinent labs  results that were available during my care of the patient were reviewed by me and considered in my medical decision making (see chart for details).     Pt presents with abd pain/nausea No h/o abdominal surgery No other medical conditions No sick contacts Her pain resolved in ED but still had nausea She is now responding to reglan  8:26 AM At signout to dr Cassandria Angeryao Finish IV fluids Give PO fluids Ambulate patient If she tolerates this she can go home   Final Clinical Impressions(s) / ED Diagnoses   Final diagnoses:  Generalized abdominal pain  Intractable cyclical vomiting with nausea  Dehydration    New Prescriptions New Prescriptions   No medications on file  I personally performed the services described in this documentation, which was scribed in my presence. The recorded information has been reviewed and is accurate.        Zadie RhineWickline, Zyan Mirkin, MD 02/10/17 [phone]

## 2017-02-10 NOTE — ED Notes (Signed)
Pt given specimen cup,  Unable to void at this time.

## 2017-02-10 NOTE — ED Triage Notes (Signed)
Pt presents with lower abd cramping with n/v since 2 days ago; pt reports IUD placed 2 months ago doesn't know if this is contributing; pt denies vaginal discharge, dysuria

## 2017-02-10 NOTE — ED Provider Notes (Signed)
  Physical Exam  BP (!) 118/56   Pulse (!) 54   Temp 97.7 F (36.5 C) (Oral)   Resp 20   Ht 5\' 7"  (1.702 m)   Wt 127 kg (280 lb)   LMP 12/18/2016   SpO2 97%   BMI 43.85 kg/m   Physical Exam  ED Course  Procedures  MDM Care assumed at 8am. Patient here with vomiting, abdominal pain. Labs and UA and CT ab/pel unremarkable. Sign out pending reassessment, PO trial. Able to keep down sprite in the ED. Will dc home with zofran prn.      Charlynne PanderYao, David Hsienta, MD 02/10/17 [id number]

## 2017-02-10 NOTE — ED Notes (Signed)
Patient transported to CT 

## 2017-02-11 ENCOUNTER — Inpatient Hospital Stay (HOSPITAL_COMMUNITY)
Admission: AD | Admit: 2017-02-11 | Discharge: 2017-02-11 | Disposition: A | Discharge status: Home / Self Care | Payer: BLUE CROSS/BLUE SHIELD | Source: Ambulatory Visit | Attending: Obstetrics | Admitting: Obstetrics

## 2017-02-11 ENCOUNTER — Encounter (HOSPITAL_COMMUNITY): Payer: Self-pay | Admitting: *Deleted

## 2017-02-11 DIAGNOSIS — R102 Pelvic and perineal pain: Secondary | ICD-10-CM | POA: Diagnosis present

## 2017-02-11 DIAGNOSIS — R112 Nausea with vomiting, unspecified: Secondary | ICD-10-CM | POA: Diagnosis present

## 2017-02-11 DIAGNOSIS — Z975 Presence of (intrauterine) contraceptive device: Secondary | ICD-10-CM | POA: Diagnosis not present

## 2017-02-11 DIAGNOSIS — R109 Unspecified abdominal pain: Secondary | ICD-10-CM | POA: Diagnosis present

## 2017-02-11 LAB — URINALYSIS, ROUTINE W REFLEX MICROSCOPIC
Bilirubin Urine: NEGATIVE
GLUCOSE, UA: NEGATIVE mg/dL
KETONES UR: 20 mg/dL — AB
Nitrite: NEGATIVE
PROTEIN: 30 mg/dL — AB
Specific Gravity, Urine: 1.011 (ref 1.005–1.030)
pH: 6 (ref 5.0–8.0)

## 2017-02-11 LAB — POCT PREGNANCY, URINE: PREG TEST UR: NEGATIVE

## 2017-02-11 MED ORDER — KETOROLAC TROMETHAMINE 10 MG PO TABS: 10.0000 mg | ORAL_TABLET | Freq: Four times a day (QID) | ORAL | 0 refills | Status: DC | PRN

## 2017-02-11 MED ORDER — PROMETHAZINE HCL 25 MG/ML IJ SOLN
25.0000 mg | INTRAMUSCULAR | Status: AC
Start: 2017-02-11 — End: 2017-02-11
  Administered 2017-02-11: 25 mg via INTRAMUSCULAR
  Filled 2017-02-11: qty 1

## 2017-02-11 MED ORDER — KETOROLAC TROMETHAMINE 60 MG/2ML IM SOLN
60.0000 mg | INTRAMUSCULAR | Status: AC
  Administered 2017-02-11: 60 mg via INTRAMUSCULAR
  Filled 2017-02-11: qty 2

## 2017-02-11 MED ORDER — PROMETHAZINE HCL 25 MG/ML IJ SOLN: 25.0000 mg | INTRAMUSCULAR | Status: DC

## 2017-02-11 MED ORDER — PROMETHAZINE HCL 12.5 MG PO TABS: 12.5000 mg | ORAL_TABLET | Freq: Four times a day (QID) | ORAL | 0 refills | Status: DC | PRN

## 2017-02-11 NOTE — MAU Note (Signed)
Past 3 days +lower abdominal cramping Rating 7/10 Has taken 3 Midol since 6am Has IUD for 2 months with intermittent bleeding;  +vaginal bleeding that started 2 days ago  +nausea/vomiting States was given medication that has subdued the vomiting but still feels nauseous. Has not been able to eat or drink anything.

## 2017-02-11 NOTE — MAU Provider Note (Signed)
History     CSN: [id number]  Arrival date and time: 02/11/17 1308   First Provider Initiated Contact with Patient 02/11/17 1421      Chief Complaint  Patient presents with  . Abdominal Pain  . Nausea  . Emesis   HPI  Caroline Maynard is a 36 yo [id number] non-pregnant female presenting to MAU with complaints of nausea and lower abdominal cramping for "3 going on 4 days".  [facility] and had a full negative work-up (labs and CT of abdomen and pelvis).  She has taken Midol 3 times today with no relief.  She had a Mirena IUD placed 2 months ago by Dr. Henderson CloudHorvath.  She has had intermittent VB since insertion of the IUD.  Her LMP was 7/28; still currently bleeding.  She states the ED gave her a Rx for Zofran, but it has only stopped the vomiting.  She is still nauseated and unable to eat or drink anything. She wants to know what is wrong with her and why no one can tell her.  Past Medical History:  Diagnosis Date  . Back pain   . Medical history non-contributory     Past Surgical History:  Procedure Laterality Date  . HIP SURGERY Right [id number]   36 years old- pushed down steps at [email or web address]    Family History  Problem Relation Age of Onset  . Aneurysm Mother     Social History  Substance Use Topics  . Smoking status: Never Smoker  . Smokeless tobacco: Never Used  . Alcohol use No    Allergies: No Known Allergies  Prescriptions Prior to Admission  Medication Sig Dispense Refill Last Dose  . Ibuprofen (MIDOL) 200 MG CAPS Take 200 mg by mouth every 6 (six) hours as needed (for cramps).   02/09/2017 at Unknown time  . ondansetron (ZOFRAN ODT) 4 MG disintegrating tablet 4mg  ODT q6 hours prn nausea/vomit 10 tablet 0     Review of Systems  Constitutional: Positive for appetite change.  HENT: Negative.   Eyes: Negative.   Respiratory: Negative.   Cardiovascular: Negative.   Gastrointestinal: Positive for abdominal pain (RLQ), nausea and vomiting.  Endocrine:  Negative.   Genitourinary: Negative.   Musculoskeletal: Negative.   Skin: Negative.   Allergic/Immunologic: Negative.   Neurological: Negative.   Hematological: Negative.   Psychiatric/Behavioral: Negative.    Physical Exam   Blood pressure (!) 138/94, pulse 61, temperature 97.7 F (36.5 C), temperature source Oral, resp. rate 16, weight 273 lb 1.9 oz (123.9 kg), last menstrual period 02/09/2017, SpO2 100 %.  Physical Exam  Constitutional: Vital signs are normal. She appears well-developed and well-nourished. She is active.  HENT:  Head: Normocephalic.  Nose: Nose normal.  GI: Soft. Normal appearance and bowel sounds are normal. There is tenderness in the right lower quadrant. There is no rebound and no guarding.  Genitourinary:  Genitourinary Comments: Pelvic deferred  Neurological: She is alert.  Skin: Skin is warm, dry and intact.  Psychiatric: She has a normal mood and affect. Her speech is normal and behavior is normal. Judgment and thought content normal. Cognition and memory are normal.    MAU Course  Procedures  MDM [facility] UPT Phenergan 25 mg IM - improved nausea; able to keep down crackers and Gingerale Toradol 60 mg IM - improved pain  *Consult with Dr. Chestine Sporelark @ [phone] - notified of patient's complaints, assessments, tx plan d/c home with Rx for Toradol and Phenergan, F/U with Dr. Henderson CloudHorvath  in 2 wks, if no improvement - ok to d/c home, agrees with plan  Assessment and Plan  Pelvic pain  - Discharge home - Instructions on pelvic pain given - F/U with Dr. Henderson CloudHorvath, if sx's persist - Return to MAU for emergencies  Allergies as of 02/11/2017   No Known Allergies     Medication List    STOP taking these medications   bismuth subsalicylate 262 MG/15ML suspension Commonly known as:  PEPTO BISMOL   MIDOL 200 MG Caps Generic drug:  Ibuprofen     TAKE these medications   calcium carbonate 500 MG chewable tablet Commonly known as:  TUMS - dosed in mg elemental  calcium Chew 1-2 tablets by mouth daily.   ketorolac 10 MG tablet Commonly known as:  TORADOL Take 1 tablet (10 mg total) by mouth every 6 (six) hours as needed for moderate pain.   ondansetron 4 MG disintegrating tablet Commonly known as:  ZOFRAN ODT 4mg  ODT q6 hours prn nausea/vomit   promethazine 12.5 MG tablet Commonly known as:  PHENERGAN Take 1 tablet (12.5 mg total) by mouth every 6 (six) hours as needed for nausea or vomiting.       Caroline Maynard Acy, MSN, CNM 02/11/2017, 2:33 PM

## 2017-02-11 NOTE — Discharge Instructions (Signed)
Pelvic Pain, Female Pelvic pain is pain in your lower abdomen, below your belly button and between your hips. The pain may start suddenly (acute), keep coming back (recurring), or last a long time (chronic). Pelvic pain that lasts longer than six months is considered chronic. Pelvic pain may affect your:  Reproductive organs.  Urinary system.  Digestive tract.  Musculoskeletal system.  There are many potential causes of pelvic pain. Sometimes, the pain can be a result of digestive or urinary conditions, strained muscles or ligaments, or even reproductive conditions. Sometimes the cause of pelvic pain is not known. Follow these instructions at home:  Take over-the-counter and prescription medicines only as told by your health care provider.  Rest as told by your health care provider.  Do not have sex it if hurts.  Keep a journal of your pelvic pain. Write down: ? When the pain started. ? Where the pain is located. ? What seems to make the pain better or worse, such as food or your menstrual cycle. ? Any symptoms you have along with the pain.  Keep all follow-up visits as told by your health care provider. This is important. Contact a health care provider if:  Medicine does not help your pain.  Your pain comes back.  You have new symptoms.  You have abnormal vaginal discharge or bleeding, including bleeding after menopause.  You have a fever or chills.  You are constipated.  You have blood in your urine or stool.  You have foul-smelling urine.  You feel weak or lightheaded. Get help right away if:  You have sudden severe pain.  Your pain gets steadily worse.  You have severe pain along with fever, nausea, vomiting, or excessive sweating.  You lose consciousness. This information is not intended to replace advice given to you by your health care provider. Make sure you discuss any questions you have with your health care provider. Document Released: 05/29/2004  Document Revised: 07/27/2015 Document Reviewed: 04/22/2015 Elsevier Interactive Patient Education  2018 [employer] Inc[employer]  

## 2018-11-15 ENCOUNTER — Ambulatory Visit (HOSPITAL_COMMUNITY)
Admission: EM | Admit: 2018-11-15 | Discharge: 2018-11-15 | Disposition: A | Discharge status: Home / Self Care | Payer: BLUE CROSS/BLUE SHIELD | Attending: Family Medicine | Admitting: Family Medicine

## 2018-11-15 ENCOUNTER — Other Ambulatory Visit: Payer: Self-pay

## 2018-11-15 DIAGNOSIS — M25472 Effusion, left ankle: Secondary | ICD-10-CM

## 2018-11-15 DIAGNOSIS — M79672 Pain in left foot: Secondary | ICD-10-CM

## 2018-11-15 MED ORDER — ACETAMINOPHEN-CODEINE #3 300-30 MG PO TABS: 1.0000 | ORAL_TABLET | ORAL | 0 refills | Status: AC | PRN

## 2018-11-15 MED ORDER — PREDNISONE 20 MG PO TABS: ORAL_TABLET | ORAL | 0 refills | Status: DC

## 2018-11-15 NOTE — ED Triage Notes (Signed)
Per pt she has been noticing that her left foot has been swelling and pain into the ankle. Pt said no injury but she does stand on her feet all day. Has noticed that it has gotten worse over the last two weeks,.Pt said hard to bend and stand on it. Walking on her heel to relieve the pressure.

## 2018-11-15 NOTE — Discharge Instructions (Addendum)
I am starting you on prednisone taper which you will take as follows: Take Prednisone 20 mg,  in mornings with breakfast as follows:  Take 3 pills for 3 days, Take 2 pills for 3 days, and Take 1 pill for 3 days.  Complete all medication.  For severe pain, I have prescribed Tylenol # 3.  For swelling, I recommend ice and heat application. If symptoms worsen, I recommend follow-up with orthopedic specialty. I have included information to contact orthopedics.

## 2018-11-15 NOTE — ED Provider Notes (Signed)
[facility]    CSN: [id number] Arrival date & time: 11/15/18  1722     History   Chief Complaint Chief Complaint  Patient presents with  . Ankle Injury    HPI Caroline Maynard is a 38 y.o. female.   HPI  Patient presents with a complaint of left foot and ankle swelling x 2 weeks. No known injury. Pain is originating from her left heel and radiates upwardly with weight-bearing. 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 She endorses use of compression socks and NSAIDS with only temporary improvement of symptoms. Today, pain has increased to the point it's difficult to bear weight. She has swelling around the ankle and lower foot which is causing difficulty wearing shoes. Characterizes pain as aching and burning. Denies any previous foot problems. Past Medical History:  Diagnosis Date  . Back pain   . Medical history non-contributory     Patient Active Problem List   Diagnosis Date Noted  . Pelvic pain 02/11/2017    Past Surgical History:  Procedure Laterality Date  . HIP SURGERY Right [id number]   38 years old- pushed down steps at [email or web address]    OB History    Gravida  3   Para  3   Term  3   Preterm      AB      Living  3     SAB      TAB      Ectopic      Multiple      Live Births  1            Home Medications    Prior to Admission medications   Medication Sig Start Date End Date Taking? Authorizing Provider  calcium carbonate (TUMS - DOSED IN MG ELEMENTAL CALCIUM) 500 MG chewable tablet Chew 1-2 tablets by mouth daily.    [provider]  ketorolac (TORADOL) 10 MG tablet Take 1 tablet (10 mg total) by mouth every 6 (six) hours as needed for moderate pain. 02/11/17   Raelyn Mora, CNM  ondansetron (ZOFRAN ODT) 4 MG disintegrating tablet 4mg  ODT q6 hours prn nausea/vomit 02/10/17   Charlynne Pander, MD  promethazine (PHENERGAN) 12.5 MG tablet Take 1 tablet (12.5 mg  total) by mouth every 6 (six) hours as needed for nausea or vomiting. 02/11/17   Raelyn Mora, CNM    Family History Family History  Problem Relation Age of Onset  . Aneurysm Mother     Social History Social History   Tobacco Use  . Smoking status: Never Smoker  . Smokeless tobacco: Never Used  Substance Use Topics  . Alcohol use: No  . Drug use: No     Allergies   Patient has no known allergies.   Review of Systems Review of Systems  Pertinent negatives listed in HPI Physical Exam Triage Vital Signs ED Triage Vitals  Enc Vitals Group     BP 11/15/18 1740 (!) 141/97     Pulse Rate 11/15/18 1740 87     Resp 11/15/18 1740 18     Temp 11/15/18 1740 98.7 F (37.1 C)     Temp Source 11/15/18 1740 Oral     SpO2 11/15/18 1740 98 %     Weight --      Height --      Head Circumference --      Peak Flow --      Pain  Score 11/15/18 1738 10     Pain Loc --      Pain Edu? --      Excl. in GC? --    No data found.  Updated Vital Signs BP (!) 141/97 (BP Location: Left Arm)   Pulse 87   Temp 98.7 F (37.1 C) (Oral)   Resp 18   SpO2 98%   Visual Acuity Right Eye Distance:   Left Eye Distance:   Bilateral Distance:    Right Eye Near:   Left Eye Near:    Bilateral Near:     Physical Exam Cardiovascular:     Rate and Rhythm: Normal rate and regular rhythm.  Pulmonary:     Effort: Pulmonary effort is normal.  Musculoskeletal:     Left ankle: She exhibits swelling. She exhibits no ecchymosis and normal pulse. Tenderness. Achilles tendon exhibits no pain and normal Thompson's test results.     Left foot: Decreased range of motion. Swelling present. No bony tenderness.      UC Treatments / Results  Labs (all labs ordered are listed, but only abnormal results are displayed) Labs Reviewed - No data to display  EKG None  Radiology No results found.  Procedures Procedures (including critical care time)  Medications Ordered in UC Medications - No  data to display  Initial Impression / Assessment and Plan / UC Course  I have reviewed the triage vital signs and the nursing notes.  Pertinent labs & imaging results that were available during my care of the patient were reviewed by me and considered in my medical decision making (see chart for details).    Suspect plantar fasciitis, although cannot rule out of possible gout. Will treat with prednisone taper and provide a short course of pain medication. Patient written out of work for 3 days and advised to return for further evaluation if symptoms do not improve. Patient verbalized understanding and agreement with plan. Final Clinical Impressions(s) / UC Diagnoses   Final diagnoses:  Left ankle swelling  Inflammatory pain of left heel     Discharge Instructions     I am starting you on prednisone taper which you will take as follows: Take Prednisone 20 mg,  in mornings with breakfast as follows:  Take 3 pills for 3 days, Take 2 pills for 3 days, and Take 1 pill for 3 days.  Complete all medication.  For severe pain, I have prescribed Tylenol # 3.  For swelling, I recommend ice and heat application. If symptoms worsen, I recommend follow-up with orthopedic specialty. I have included information to contact orthopedics.    ED Prescriptions    Medication Sig Dispense Auth. Provider   predniSONE (DELTASONE) 20 MG tablet Take 3 PO QAM x3days, 2 PO QAM x3days, 1 PO QAM x3days 18 tablet Bing NeighborsHarris, Kolson Chovanec S, FNP   acetaminophen-codeine (TYLENOL #3) 300-30 MG tablet Take 1 tablet by mouth every 4 (four) hours as needed for up to 3 days for moderate pain. 18 tablet Bing NeighborsHarris, Gwenyth Dingee S, FNP     Controlled Substance Prescriptions [place] Controlled Substance Registry consulted? Not Applicable   Bing NeighborsHarris, Demarie Uhlig S, FNP 11/17/18 0630

## 2019-07-29 ENCOUNTER — Ambulatory Visit (INDEPENDENT_AMBULATORY_CARE_PROVIDER_SITE_OTHER): Payer: BC Managed Care – PPO

## 2019-07-29 ENCOUNTER — Other Ambulatory Visit: Payer: Self-pay

## 2019-07-29 ENCOUNTER — Ambulatory Visit (INDEPENDENT_AMBULATORY_CARE_PROVIDER_SITE_OTHER): Payer: BC Managed Care – PPO | Admitting: Podiatry

## 2019-07-29 DIAGNOSIS — M7662 Achilles tendinitis, left leg: Secondary | ICD-10-CM | POA: Diagnosis not present

## 2019-07-29 DIAGNOSIS — M7661 Achilles tendinitis, right leg: Secondary | ICD-10-CM

## 2019-07-29 MED ORDER — DICLOFENAC SODIUM 75 MG PO TBEC: 75.0000 mg | DELAYED_RELEASE_TABLET | Freq: Two times a day (BID) | ORAL | 1 refills | Status: DC

## 2019-08-01 NOTE — Progress Notes (Signed)
   HPI: 39 y.o. female presenting today as a new patient with a chief complaint of burning left heel pain that has been gradually worsening over the past year. Walking and standing increases the pain. She has been taking OTC pain medication, wearing compression socks and supportive shoes for treatment. Patient is here for further evaluation and treatment.   Past Medical History:  Diagnosis Date  . Back pain   . Medical history non-contributory       Physical Exam: General: The patient is alert and oriented x3 in no acute distress.  Dermatology: Skin is warm, dry and supple bilateral lower extremities. Negative for open lesions or macerations.  Vascular: Palpable pedal pulses bilaterally. No edema or erythema noted. Capillary refill within normal limits.  Neurological: Epicritic and protective threshold grossly intact bilaterally.   Musculoskeletal Exam: Pain on palpation noted to the posterior tubercle of the left calcaneus at the insertion of the Achilles tendon consistent with retrocalcaneal bursitis. Range of motion within normal limits. Muscle strength 5/5 in all muscle groups bilateral lower extremities.  Radiographic Exam:  Posterior calcaneal spur noted to the respective calcaneus on lateral view. No fracture or dislocation noted. Normal osseous mineralization noted.     Assessment: 1. Insertional Achilles tendinitis left 2. Retrocalcaneal bursitis   Plan of Care:  1. Patient was evaluated. Radiographs were reviewed today. 2. Injection of 0.5 mL Celestone Soluspan injected into the retrocalcaneal bursa. Care was taken to avoid direct injection into the Achilles tendon. 3. CAM boot dispensed.  4. Prescription for Diclofenac provided to patient. 5. Appointment with Raiford Noble, Pedorthist, for custom molded orthotics.  6. Return to clinic in 4 weeks.   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    Felecia Shelling, DPM [facility]  Dr. Felecia Shelling, DPM    [address]                                        [place], [place] [address]                Office [phone]  Fax [phone]

## 2019-08-26 ENCOUNTER — Ambulatory Visit: Payer: BC Managed Care – PPO | Admitting: Podiatry

## 2019-08-31 ENCOUNTER — Ambulatory Visit (INDEPENDENT_AMBULATORY_CARE_PROVIDER_SITE_OTHER): Payer: BC Managed Care – PPO | Admitting: Podiatry

## 2019-08-31 ENCOUNTER — Other Ambulatory Visit: Payer: Self-pay

## 2019-08-31 DIAGNOSIS — M7662 Achilles tendinitis, left leg: Secondary | ICD-10-CM

## 2019-09-02 NOTE — Progress Notes (Signed)
   HPI: 39 y.o. female presenting today for follow up evaluation of insertional Achilles tendinitis. She states the injection and wearing the boot has decreased the pain. She has also been taking Diclofenac which has helped alleviate her symptoms. She denies any worsening factors at this time. Patient is here for further evaluation and treatment.   Past Medical History:  Diagnosis Date  . Back pain   . Medical history non-contributory       Physical Exam: General: The patient is alert and oriented x3 in no acute distress.  Dermatology: Skin is warm, dry and supple bilateral lower extremities. Negative for open lesions or macerations.  Vascular: Palpable pedal pulses bilaterally. No edema or erythema noted. Capillary refill within normal limits.  Neurological: Epicritic and protective threshold grossly intact bilaterally.   Musculoskeletal Exam: Pain on palpation noted to the posterior tubercle of the left calcaneus at the insertion of the Achilles tendon consistent with retrocalcaneal bursitis. Range of motion within normal limits. Muscle strength 5/5 in all muscle groups bilateral lower extremities.  Assessment: 1. Insertional Achilles tendinitis left - improved  2. Retrocalcaneal bursitis   Plan of Care:  1. Patient was evaluated.  2. Ankle brace dispensed. Discontinue using CAM boot.  3. Continue taking Diclofenac as needed.  4. Appointment with Raiford Noble, Pedorthist, for custom molded orthotics.  5. Return to clinic as needed.    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    Felecia Shelling, DPM [facility]  Dr. Felecia Shelling, DPM    [address]                                        [place], [place] [address]                Office [phone]  Fax [phone]

## 2019-09-25 ENCOUNTER — Other Ambulatory Visit: Payer: BC Managed Care – PPO | Admitting: Orthotics

## 2019-12-08 ENCOUNTER — Other Ambulatory Visit: Payer: Self-pay

## 2019-12-08 MED ORDER — DICLOFENAC SODIUM 75 MG PO TBEC: 75.0000 mg | DELAYED_RELEASE_TABLET | Freq: Two times a day (BID) | ORAL | 1 refills | Status: DC

## 2020-02-29 ENCOUNTER — Ambulatory Visit (INDEPENDENT_AMBULATORY_CARE_PROVIDER_SITE_OTHER): Payer: BC Managed Care – PPO

## 2020-02-29 ENCOUNTER — Ambulatory Visit
Admission: EM | Admit: 2020-02-29 | Discharge: 2020-02-29 | Disposition: A | Discharge status: Home / Self Care | Payer: BC Managed Care – PPO | Attending: Physician Assistant | Admitting: Physician Assistant

## 2020-02-29 DIAGNOSIS — J4521 Mild intermittent asthma with (acute) exacerbation: Secondary | ICD-10-CM

## 2020-02-29 DIAGNOSIS — R0602 Shortness of breath: Secondary | ICD-10-CM

## 2020-02-29 DIAGNOSIS — R05 Cough: Secondary | ICD-10-CM

## 2020-02-29 DIAGNOSIS — U071 COVID-19: Secondary | ICD-10-CM | POA: Diagnosis not present

## 2020-02-29 MED ORDER — DEXAMETHASONE SODIUM PHOSPHATE 10 MG/ML IJ SOLN
10.0000 mg | Freq: Once | INTRAMUSCULAR | Status: AC
  Administered 2020-02-29: 10 mg via INTRAMUSCULAR

## 2020-02-29 MED ORDER — ALBUTEROL SULFATE (2.5 MG/3ML) 0.083% IN NEBU: 2.5000 mg | INHALATION_SOLUTION | Freq: Four times a day (QID) | RESPIRATORY_TRACT | 0 refills | Status: AC | PRN

## 2020-02-29 MED ORDER — ALBUTEROL SULFATE HFA 108 (90 BASE) MCG/ACT IN AERS
2.0000 | INHALATION_SPRAY | Freq: Once | RESPIRATORY_TRACT | Status: AC
  Administered 2020-02-29: 2 via RESPIRATORY_TRACT

## 2020-02-29 MED ORDER — PREDNISONE 50 MG PO TABS: 50.0000 mg | ORAL_TABLET | Freq: Every day | ORAL | 0 refills | Status: AC

## 2020-02-29 NOTE — ED Triage Notes (Signed)
Pt states covid positive on 08/01 and now having increase SOB with coughing x3 days. On arrival after putting a mask on became more SOB with continuous coughing with O2 sats 86%. Pt placed on NRM o2 15l/m with O2 increased to 99% and decreased coughing. Pt unable to take a deep breath without coughing.

## 2020-02-29 NOTE — Discharge Instructions (Signed)
Chest xray negative. Decadron injection in office today. Use albuterol nebulizer at home as directed. You can start using inhaler as needed when you can take deeper breaths. Prednisone as directed. If worsening symptoms, go to the ED for further evaluation.

## 2020-02-29 NOTE — ED Provider Notes (Signed)
[facility]    CSN: [id number] Arrival date & time: 02/29/20  1401      History   Chief Complaint Chief Complaint  Patient presents with  . Shortness of Breath    HPI Caroline Maynard is a 39 y.o. female.   39 year old female comes in for 3 day history of shortness of breath, cough after being tested positive for COVID 02/14/2020. States fever has resolved for 1 week. States shortness of breath mostly triggered by coughing fits with deep breathing/talking. Never smoker. Denies history of asthma.      Past Medical History:  Diagnosis Date  . Back pain   . Medical history non-contributory     Patient Active Problem List   Diagnosis Date Noted  . Pelvic pain 02/11/2017    Past Surgical History:  Procedure Laterality Date  . HIP SURGERY Right 4180   39 years old- pushed down steps at [email or web address]    OB History    Gravida  3   Para  3   Term  3   Preterm      AB      Living  3     SAB      TAB      Ectopic      Multiple      Live Births  1            Home Medications    Prior to Admission medications   Medication Sig Start Date End Date Taking? Authorizing Provider  albuterol (PROVENTIL) (2.5 MG/3ML) 0.083% nebulizer solution Take 3 mLs (2.5 mg total) by nebulization every 6 (six) hours as needed for wheezing or shortness of breath. 02/29/20   Belinda Fisher, PA-C  predniSONE (DELTASONE) 50 MG tablet Take 1 tablet (50 mg total) by mouth daily with breakfast. 02/29/20   Belinda Fisher, PA-C    Family History Family History  Problem Relation Age of Onset  . Aneurysm Mother     Social History Social History   Tobacco Use  . Smoking status: Never Smoker  . Smokeless tobacco: Never Used  Substance Use Topics  . Alcohol use: No  . Drug use: No     Allergies   Patient has no known allergies.   Review of Systems Review of Systems  Reason unable to perform ROS: See HPI as above.     Physical Exam Triage Vital Signs ED Triage  Vitals  Enc Vitals Group     BP 02/29/20 1420 99/67     Pulse Rate 02/29/20 1417 91     Resp 02/29/20 1417 (!) 24     Temp 02/29/20 1417 (!) 97.2 F (36.2 C)     Temp Source 02/29/20 1417 Oral     SpO2 02/29/20 1417 99 %     Weight --      Height --      Head Circumference --      Peak Flow --      Pain Score 02/29/20 1420 0     Pain Loc --      Pain Edu? --      Excl. in GC? --    No data found.  Updated Vital Signs BP 99/67 (BP Location: Left Arm)   Pulse 91   Temp (!) 97.2 F (36.2 C) (Oral)   Resp (!) 24   SpO2 99%    Physical Exam Constitutional:      General: She is not in acute distress.  Appearance: Normal appearance. She is well-developed. She is not toxic-appearing or diaphoretic.  HENT:     Head: Normocephalic and atraumatic.  Eyes:     Conjunctiva/sclera: Conjunctivae normal.     Pupils: Pupils are equal, round, and reactive to light.  Cardiovascular:     Rate and Rhythm: Normal rate and regular rhythm.  Pulmonary:     Comments: Coughing throughout exam. However, LCTAB. Effort normal once coughing resolves.  Musculoskeletal:     Cervical back: Normal range of motion and neck supple.  Skin:    General: Skin is warm and dry.  Neurological:     Mental Status: She is alert and oriented to person, place, and time.      UC Treatments / Results  Labs (all labs ordered are listed, but only abnormal results are displayed) Labs Reviewed - No data to display  EKG   Radiology DG Chest 2 View  Result Date: 02/29/2020 CLINICAL DATA:  Cough and shortness of breath since July, 2021. EXAM: CHEST - 2 VIEW COMPARISON:  None. FINDINGS: The lungs are clear. Heart size is normal. No pneumothorax or pleural fluid. No acute or focal bony abnormality. IMPRESSION: No acute disease. Electronically Signed   By: Drusilla Kanner M.D.   On: 02/29/2020 14:51    Procedures Procedures (including critical care time)  Medications Ordered in UC Medications  albuterol  (VENTOLIN HFA) 108 (90 Base) MCG/ACT inhaler 2 puff (2 puffs Inhalation Given 02/29/20 1518)  dexamethasone (DECADRON) injection 10 mg (10 mg Intramuscular Given 02/29/20 1518)    Initial Impression / Assessment and Plan / UC Course  I have reviewed the triage vital signs and the nursing notes.  Pertinent labs & imaging results that were available during my care of the patient were reviewed by me and considered in my medical decision making (see chart for details).    Patient at first was tachypneic with cough at triage, initial O2 sat 86% during coughing fits and was placed on O2. However, once cough subsided and O2 removed, patient maintained O2 sat 95-97%. Continued cough when talking and deep breathing. Unable to do neutralizer treatment with current protocol, attempted albuterol inhaler. Patient unable to tolerate inhaler use due to coughing.   CXR negative for active cardiopulmonary disease. Patient maintained O2 sat above 95% with ambulation. Decadron injection in office today. Neb treatments at home. Strict return precautions given.   Final Clinical Impressions(s) / UC Diagnoses   Final diagnoses:  COVID-19  Mild intermittent reactive airway disease with acute exacerbation    ED Prescriptions    Medication Sig Dispense Auth. Provider   predniSONE (DELTASONE) 50 MG tablet Take 1 tablet (50 mg total) by mouth daily with breakfast. 5 tablet Evans Levee V, PA-C   albuterol (PROVENTIL) (2.5 MG/3ML) 0.083% nebulizer solution Take 3 mLs (2.5 mg total) by nebulization every 6 (six) hours as needed for wheezing or shortness of breath. 75 mL Belinda Fisher, PA-C     PDMP not reviewed this encounter.   Belinda Fisher, PA-C 02/29/20 2244

## 2020-04-18 DIAGNOSIS — Z0000 Encounter for general adult medical examination without abnormal findings: Secondary | ICD-10-CM | POA: Diagnosis not present

## 2020-04-18 DIAGNOSIS — R1084 Generalized abdominal pain: Secondary | ICD-10-CM | POA: Diagnosis not present

## 2020-04-18 DIAGNOSIS — Z13220 Encounter for screening for lipoid disorders: Secondary | ICD-10-CM | POA: Diagnosis not present

## 2020-04-18 DIAGNOSIS — R195 Other fecal abnormalities: Secondary | ICD-10-CM | POA: Diagnosis not present

## 2020-04-18 DIAGNOSIS — I872 Venous insufficiency (chronic) (peripheral): Secondary | ICD-10-CM | POA: Diagnosis not present

## 2020-04-18 DIAGNOSIS — M7989 Other specified soft tissue disorders: Secondary | ICD-10-CM | POA: Diagnosis not present

## 2020-04-18 DIAGNOSIS — Z23 Encounter for immunization: Secondary | ICD-10-CM | POA: Diagnosis not present

## 2020-05-18 DIAGNOSIS — M7989 Other specified soft tissue disorders: Secondary | ICD-10-CM | POA: Diagnosis not present

## 2020-05-18 DIAGNOSIS — R319 Hematuria, unspecified: Secondary | ICD-10-CM | POA: Diagnosis not present

## 2020-05-18 DIAGNOSIS — I872 Venous insufficiency (chronic) (peripheral): Secondary | ICD-10-CM | POA: Diagnosis not present

## 2020-05-18 DIAGNOSIS — R1084 Generalized abdominal pain: Secondary | ICD-10-CM | POA: Diagnosis not present

## 2020-12-03 DIAGNOSIS — J09X2 Influenza due to identified novel influenza A virus with other respiratory manifestations: Secondary | ICD-10-CM | POA: Diagnosis not present

## 2020-12-03 DIAGNOSIS — R509 Fever, unspecified: Secondary | ICD-10-CM | POA: Diagnosis not present

## 2020-12-03 DIAGNOSIS — J189 Pneumonia, unspecified organism: Secondary | ICD-10-CM | POA: Diagnosis not present

## 2020-12-03 DIAGNOSIS — J101 Influenza due to other identified influenza virus with other respiratory manifestations: Secondary | ICD-10-CM | POA: Diagnosis not present

## 2021-04-26 DIAGNOSIS — Z0000 Encounter for general adult medical examination without abnormal findings: Secondary | ICD-10-CM | POA: Diagnosis not present

## 2021-05-03 DIAGNOSIS — Z0000 Encounter for general adult medical examination without abnormal findings: Secondary | ICD-10-CM | POA: Diagnosis not present

## 2021-05-03 DIAGNOSIS — I872 Venous insufficiency (chronic) (peripheral): Secondary | ICD-10-CM | POA: Diagnosis not present

## 2021-05-03 DIAGNOSIS — Z23 Encounter for immunization: Secondary | ICD-10-CM | POA: Diagnosis not present

## 2021-11-09 DIAGNOSIS — J029 Acute pharyngitis, unspecified: Secondary | ICD-10-CM | POA: Diagnosis not present

## 2021-11-14 DIAGNOSIS — R053 Chronic cough: Secondary | ICD-10-CM | POA: Diagnosis not present

## 2021-11-14 DIAGNOSIS — J019 Acute sinusitis, unspecified: Secondary | ICD-10-CM | POA: Diagnosis not present

## 2021-11-14 DIAGNOSIS — R109 Unspecified abdominal pain: Secondary | ICD-10-CM | POA: Diagnosis not present

## 2021-11-14 DIAGNOSIS — M791 Myalgia, unspecified site: Secondary | ICD-10-CM | POA: Diagnosis not present
